# Patient Record
Sex: Male | Born: 1970 | Race: White | Hispanic: No | Marital: Single | Smoking: Never smoker
Health system: Southern US, Community
[De-identification: ages and names within clinical notes are randomized; demographics above are authoritative.]

## PROBLEM LIST (undated history)

## (undated) DIAGNOSIS — K5090 Crohn's disease, unspecified, without complications: Secondary | ICD-10-CM

## (undated) DIAGNOSIS — I82409 Acute embolism and thrombosis of unspecified deep veins of unspecified lower extremity: Secondary | ICD-10-CM

## (undated) HISTORY — PX: LEG SURGERY: SHX1003

## (undated) HISTORY — PX: HERNIA REPAIR: SHX51

---

## 2015-02-17 ENCOUNTER — Telehealth: Payer: Self-pay | Admitting: Internal Medicine

## 2015-02-17 NOTE — Telephone Encounter (Signed)
Received [facility] records and placed on Dr. Lamar Sprinkles desk for review. Patient is requesting a 2nd opinion of Crohn's colitis. Dr. Marina Goodell is Doc of the Day.

## 2015-02-19 NOTE — Telephone Encounter (Signed)
Dr. Russella Dar has declined to accept. Informed patient of this and he states that he would like his records shredded.

## 2015-02-19 NOTE — Telephone Encounter (Signed)
Dr. Marina Goodell has declined to accept patient. Records placed on Dr. Ardell Isaacs desk for review.

## 2016-09-28 ENCOUNTER — Emergency Department (HOSPITAL_BASED_OUTPATIENT_CLINIC_OR_DEPARTMENT_OTHER)
Admission: EM | Admit: 2016-09-28 | Discharge: 2016-09-28 | Disposition: A | Discharge status: Home / Self Care | Payer: 59 | Attending: Emergency Medicine | Admitting: Emergency Medicine

## 2016-09-28 ENCOUNTER — Encounter (HOSPITAL_BASED_OUTPATIENT_CLINIC_OR_DEPARTMENT_OTHER): Payer: Self-pay

## 2016-09-28 DIAGNOSIS — R600 Localized edema: Secondary | ICD-10-CM | POA: Insufficient documentation

## 2016-09-28 DIAGNOSIS — R2241 Localized swelling, mass and lump, right lower limb: Secondary | ICD-10-CM | POA: Diagnosis present

## 2016-09-28 HISTORY — DX: Crohn's disease, unspecified, without complications: K50.90

## 2016-09-28 MED ORDER — ENOXAPARIN SODIUM 80 MG/0.8ML ~~LOC~~ SOLN
1.0000 mg/kg | Freq: Once | SUBCUTANEOUS | Status: AC
  Administered 2016-09-28: 23:00:00 70 mg via SUBCUTANEOUS
  Filled 2016-09-28: qty 0.8

## 2016-09-28 NOTE — ED Triage Notes (Signed)
C/o chronic swelling to right LE-worse x 2 weeks-hx of surgery to leg 1999-denies new recent injury-NAD-steady gait

## 2016-09-28 NOTE — ED Provider Notes (Signed)
[facility]   CSN: [id number] Arrival date & time: 09/28/16  2110   By signing my name below, I, Freida Busman, attest that this documentation has been prepared under the direction and in the presence of Nyeshia Mysliwiec, Barbara Cower, MD . Electronically Signed: Freida Busman, Scribe. 09/28/2016. 10:13 PM.  History   Chief Complaint Chief Complaint  Patient presents with  . Leg Swelling   The history is provided by the patient. No language interpreter was used.    HPI Comments:  Victor Branch is a 46 y.o. male who presents to the Emergency Department complaining of gradually worsening, swelling to the right lower leg x a few weeks. He reports a h/o injury to the right ankle and surgery requiring hardware ~20 years ago. He denies pain to the RLE.  No recent trauma/injury, surgery, hospitalization or long periods of immobilization. No h/o PE/DVT. No fever, CP, or SOB. No alleviating factors noted.    Past Medical History:  Diagnosis Date  . Crohn disease (HCC)     There are no active problems to display for this patient.   Past Surgical History:  Procedure Laterality Date  . HERNIA REPAIR    . LEG SURGERY         Home Medications    Prior to Admission medications   Medication Sig Start Date End Date Taking? Authorizing Provider  PREDNISONE PO Take by mouth.   Yes [provider]    Family History No family history on file.  Social History Social History  Substance Use Topics  . Smoking status: Never Smoker  . Smokeless tobacco: Never Used  . Alcohol use No     Allergies   Patient has no known allergies.   Review of Systems Review of Systems  Constitutional: Negative for chills and fever.  Respiratory: Negative for shortness of breath.   Cardiovascular: Positive for leg swelling (ankle). Negative for chest pain.     Physical Exam Updated Vital Signs BP 125/88 (BP Location: Right Arm)   Pulse 81   Temp 98.2 F (36.8 C) (Oral)    Resp 16   Ht 6' (1.829 m)   Wt 70.8 kg (156 lb 1.6 oz)   SpO2 96%   BMI 21.17 kg/m   Physical Exam  Constitutional: He is oriented to person, place, and time. He appears well-developed and well-nourished. No distress.  HENT:  Head: Normocephalic and atraumatic.  Eyes: Conjunctivae are normal.  Cardiovascular: Normal rate.   Pulmonary/Chest: Effort normal.  Abdominal: He exhibits no distension.  Musculoskeletal: He exhibits edema.  Right leg with non pitting edema up to mid shin Equal DP pulses bilaterally No erythema, warmth or TTP in the RLE specifically in popliteal fossa or gastrocnemius No pain with dorsal or plantar flexion   Neurological: He is alert and oriented to person, place, and time.  Skin: Skin is warm and dry. No pallor.  Psychiatric: He has a normal mood and affect.  Nursing note and vitals reviewed.    ED Treatments / Results  DIAGNOSTIC STUDIES:  Oxygen Saturation is 99% on RA, normal by my interpretation.    COORDINATION OF CARE:  10:12 PM Discussed treatment plan with pt at bedside and pt agreed to plan.  Labs (all labs ordered are listed, but only abnormal results are displayed) Labs Reviewed - No data to display  EKG  EKG Interpretation None       Radiology No results found.  Procedures Procedures (including critical care time)  Medications Ordered  in ED Medications  enoxaparin (LOVENOX) injection 70 mg (70 mg Subcutaneous Given 09/28/16 2257)    Initial Impression / Assessment and Plan / ED Course  I have reviewed the triage vital signs and the nursing notes.  Pertinent labs & imaging results that were available during my care of the patient were reviewed by me and considered in my medical decision making (see chart for details).  Possible DVT. No e/o cellulitis. Can't get [place] at this time.  Counseled pt on Lovenox. Pt instructed to return tomorrow for [place] and to return sooner should he develop CP or SOB.   Final Clinical  Impressions(s) / ED Diagnoses   Final diagnoses:  Leg edema   I personally performed the services described in this documentation, which was scribed in my presence. The recorded information has been reviewed and is accurate.    Yina Riviere, Barbara Cower, MD 09/29/16 [phone]

## 2016-09-29 ENCOUNTER — Emergency Department (HOSPITAL_BASED_OUTPATIENT_CLINIC_OR_DEPARTMENT_OTHER)
Admission: EM | Admit: 2016-09-29 | Discharge: 2016-09-29 | Disposition: A | Discharge status: Home / Self Care | Payer: 59 | Attending: Physician Assistant | Admitting: Physician Assistant

## 2016-09-29 ENCOUNTER — Ambulatory Visit (HOSPITAL_BASED_OUTPATIENT_CLINIC_OR_DEPARTMENT_OTHER)
Admission: RE | Admit: 2016-09-29 | Discharge: 2016-09-29 | Disposition: A | Discharge status: Home / Self Care | Payer: 59 | Source: Ambulatory Visit | Attending: Emergency Medicine | Admitting: Emergency Medicine

## 2016-09-29 ENCOUNTER — Encounter (HOSPITAL_BASED_OUTPATIENT_CLINIC_OR_DEPARTMENT_OTHER): Payer: Self-pay

## 2016-09-29 DIAGNOSIS — Z79899 Other long term (current) drug therapy: Secondary | ICD-10-CM | POA: Insufficient documentation

## 2016-09-29 DIAGNOSIS — I82431 Acute embolism and thrombosis of right popliteal vein: Secondary | ICD-10-CM | POA: Insufficient documentation

## 2016-09-29 DIAGNOSIS — K5090 Crohn's disease, unspecified, without complications: Secondary | ICD-10-CM | POA: Diagnosis not present

## 2016-09-29 DIAGNOSIS — I824Y1 Acute embolism and thrombosis of unspecified deep veins of right proximal lower extremity: Secondary | ICD-10-CM | POA: Diagnosis not present

## 2016-09-29 DIAGNOSIS — I82519 Chronic embolism and thrombosis of unspecified femoral vein: Secondary | ICD-10-CM | POA: Diagnosis present

## 2016-09-29 DIAGNOSIS — I82411 Acute embolism and thrombosis of right femoral vein: Secondary | ICD-10-CM | POA: Insufficient documentation

## 2016-09-29 DIAGNOSIS — I82539 Chronic embolism and thrombosis of unspecified popliteal vein: Secondary | ICD-10-CM | POA: Diagnosis not present

## 2016-09-29 DIAGNOSIS — M7989 Other specified soft tissue disorders: Secondary | ICD-10-CM | POA: Insufficient documentation

## 2016-09-29 LAB — CBC WITH DIFFERENTIAL/PLATELET
Basophils Absolute: 0 10*3/uL (ref 0.0–0.1)
Basophils Relative: 0 %
EOS ABS: 0 10*3/uL (ref 0.0–0.7)
EOS PCT: 1 %
HCT: 37.7 % — ABNORMAL LOW (ref 39.0–52.0)
Hemoglobin: 12.3 g/dL — ABNORMAL LOW (ref 13.0–17.0)
LYMPHS ABS: 1.9 10*3/uL (ref 0.7–4.0)
Lymphocytes Relative: 29 %
MCH: 24.6 pg — ABNORMAL LOW (ref 26.0–34.0)
MCHC: 32.6 g/dL (ref 30.0–36.0)
MCV: 75.6 fL — ABNORMAL LOW (ref 78.0–100.0)
MONOS PCT: 12 %
Monocytes Absolute: 0.8 10*3/uL (ref 0.1–1.0)
Neutro Abs: 3.7 10*3/uL (ref 1.7–7.7)
Neutrophils Relative %: 58 %
PLATELETS: 254 10*3/uL (ref 150–400)
RBC: 4.99 MIL/uL (ref 4.22–5.81)
RDW: 15.4 % (ref 11.5–15.5)
WBC: 6.3 10*3/uL (ref 4.0–10.5)

## 2016-09-29 LAB — COMPREHENSIVE METABOLIC PANEL
ALT: 31 U/L (ref 17–63)
ANION GAP: 7 (ref 5–15)
AST: 27 U/L (ref 15–41)
Albumin: 3.5 g/dL (ref 3.5–5.0)
Alkaline Phosphatase: 99 U/L (ref 38–126)
BUN: 16 mg/dL (ref 6–20)
CALCIUM: 9.1 mg/dL (ref 8.9–10.3)
CHLORIDE: 105 mmol/L (ref 101–111)
CO2: 25 mmol/L (ref 22–32)
Creatinine, Ser: 1.14 mg/dL (ref 0.61–1.24)
GFR calc non Af Amer: >60 mL/min (ref >60)
Glucose, Bld: 98 mg/dL (ref 65–99)
Potassium: 4.3 mmol/L (ref 3.5–5.1)
SODIUM: 137 mmol/L (ref 135–145)
Total Bilirubin: 0.5 mg/dL (ref 0.3–1.2)
Total Protein: 6.6 g/dL (ref 6.5–8.1)

## 2016-09-29 LAB — PROTIME-INR
INR: 1
Prothrombin Time: 13.2 seconds (ref 11.4–15.2)

## 2016-09-29 MED ORDER — RIVAROXABAN (XARELTO) EDUCATION KIT FOR DVT/PE PATIENTS
PACK | Freq: Once | Status: AC
  Administered 2016-09-29: 17:00:00

## 2016-09-29 MED ORDER — RIVAROXABAN 15 MG PO TABS
15.0000 mg | ORAL_TABLET | Freq: Two times a day (BID) | ORAL | Status: DC
  Administered 2016-09-29: 15 mg via ORAL
  Filled 2016-09-29: qty 1

## 2016-09-29 MED ORDER — RIVAROXABAN (XARELTO) VTE STARTER PACK (15 & 20 MG): ORAL_TABLET | ORAL | 0 refills | Status: DC

## 2016-09-29 MED FILL — XARELTO STARTER PACK: 15 & 20 | 30 days supply | Qty: 51 | Fill #0

## 2016-09-29 NOTE — Discharge Instructions (Signed)
You were seen today for swelling in your leg and found to have a blood clot. We are treating Victor Branch with Xarelto, this medication will prevent second clot, we anticipate the clot to be able to dissolve over time. However we will need you to follow up with interventional radiology to see if they have any advice to help decrease down stream sequela of this clot. Please call the office on Monday and ask for follow-up appointment for DVT.  In addition we'll be taking this new medication called Xarelto. Cannot take other medications such as ibuprofen with it. Please follow-up with your primary care physician. We talked to the coverage of Dr. Luiz Iron who will need to see you for follow-up in the next couple weeks.

## 2016-09-29 NOTE — ED Provider Notes (Signed)
[facility] Provider Note   CSN: [id number] Arrival date & time: 09/29/16  1501     History   Chief Complaint Chief Complaint  Patient presents with  . DVT    HPI Jaylin Benzel is a 46 y.o. male.  HPI  Pt is a 46 year old seen yesterdya for leg swelling set up for DVT US today. DVT US shows + DVT in femoral and popilteal. This has been going on for 2 weeks. No recent travel. Pt has history of rod in the RLE 20 years ago.  Very active patient. Ho chron's on prednisone.   Past Medical History:  Diagnosis Date  . Crohn disease ([place])     There are no active problems to display for this patient.   Past Surgical History:  Procedure Laterality Date  . HERNIA REPAIR    . LEG SURGERY         Home Medications    Prior to Admission medications   Medication Sig Start Date End Date Taking? Authorizing Provider  PREDNISONE PO Take by mouth.    [provider]    Family History No family history on file.  Social History Social History  Substance Use Topics  . Smoking status: Never Smoker  . Smokeless tobacco: Never Used  . Alcohol use No     Allergies   Patient has no known allergies.   Review of Systems Review of Systems  Constitutional: Negative for activity change, fatigue and fever.  Respiratory: Negative for cough, chest tightness and shortness of breath.   Cardiovascular: Positive for leg swelling. Negative for chest pain.  Gastrointestinal: Negative for abdominal pain.     Physical Exam Updated Vital Signs BP 129/71 (BP Location: Right Arm)   Pulse 76   Temp 98.9 F (37.2 C) (Oral)   Resp 18   Ht 6' (1.829 m)   Wt 70.8 kg (156 lb)   SpO2 100%   BMI 21.16 kg/m   Physical Exam  Constitutional: He is oriented to person, place, and time. He appears well-nourished.  HENT:  Head: Normocephalic.  Eyes: Conjunctivae are normal.  Cardiovascular: Normal rate and regular rhythm.   Pulmonary/Chest: Effort normal and breath  sounds normal. No respiratory distress. He has no wheezes. He exhibits no tenderness.  Abdominal: Soft. There is no tenderness.  Musculoskeletal: He exhibits edema.  RLE with edema. + pulses. Sensation intact.   Neurological: He is oriented to person, place, and time.  Skin: Skin is warm and dry. He is not diaphoretic.  Psychiatric: He has a normal mood and affect. His behavior is normal.     ED Treatments / Results  Labs (all labs ordered are listed, but only abnormal results are displayed) Labs Reviewed  CBC WITH DIFFERENTIAL/PLATELET - Abnormal; Notable for the following:       Result Value   Hemoglobin 12.3 (*)    HCT 37.7 (*)    MCV 75.6 (*)    MCH 24.6 (*)    All other components within normal limits  COMPREHENSIVE METABOLIC PANEL  PROTIME-INR    EKG  EKG Interpretation None       Radiology US Venous Img Lower Unilateral Right  Result Date: 09/29/2016 CLINICAL DATA:  46 y/o M; right lower leg swelling and redness for 2 weeks. EXAM: RIGHT LOWER EXTREMITY VENOUS DOPPLER ULTRASOUND TECHNIQUE: Gray-scale sonography with graded compression, as well as color Doppler and duplex ultrasound were performed to evaluate the lower extremity deep venous systems from the level of  the common femoral vein and including the common femoral, femoral, profunda femoral, popliteal and calf veins including the posterior tibial, peroneal and gastrocnemius veins when visible. The superficial great saphenous vein was also interrogated. Spectral Doppler was utilized to evaluate flow at rest and with distal augmentation maneuvers in the common femoral, femoral and popliteal veins. COMPARISON:  None. FINDINGS: Contralateral Common Femoral Vein: Respiratory phasicity is normal and symmetric with the symptomatic side. No evidence of thrombus. Normal compressibility. Common Femoral Vein: No evidence of thrombus. Normal compressibility, respiratory phasicity and response to augmentation. Saphenofemoral  Junction: No evidence of thrombus. Normal compressibility and flow on color Doppler imaging. Profunda Femoral Vein: No evidence of thrombus. Normal compressibility and flow on color Doppler imaging. Femoral Vein: Nonocclusive noncompressible filling defect distally. Popliteal Vein: Not nearly occlusive noncompressible filling defect. Calf Veins: Suboptimal evaluation due to extensive edema. No definite evidence for thrombus. Superficial Great Saphenous Vein: No evidence of thrombus. Normal compressibility and flow on color Doppler imaging. Venous Reflux:  None. Other Findings:  None. IMPRESSION: Positive for deep venous thrombosis of the distal right femoral and popliteal veins, nearly occlusive in the popliteal vein. These results will be called to the ordering clinician or representative by the Radiologist Assistant, and communication documented in the PACS or zVision Dashboard. Electronically Signed   By: Kristine Garbe M.D.   On: 09/29/2016 14:50    Procedures Procedures (including critical care time)  Medications Ordered in ED Medications  rivaroxaban (XARELTO) Education Kit for DVT/PE patients (not administered)  Rivaroxaban (XARELTO) tablet 15 mg (not administered)     Initial Impression / Assessment and Plan / ED Course  I have reviewed the triage vital signs and the nursing notes.  Pertinent labs & imaging results that were available during my care of the patient were reviewed by me and considered in my medical decision making (see chart for details).    Patient is a 46 year old male with history of Crohn's, on prednisone presenting with right lower extremity edema. This going on for over 2 weeks. Patient has no risk factors.  Discussed with interventional radiology on call. 2 weeks is on the outside windo that would help with targeted, lysis. They recommend follow-up in clinic.  Patient has no shortness breath do not suspect PE.  Discussed with the on-call provider for  patient's primary care physician. Will treat with Julious Oka and have patient follow up in clinic.   Final Clinical Impressions(s) / ED Diagnoses   Final diagnoses:  None    New Prescriptions New Prescriptions   No medications on file     Macarthur Critchley, MD 09/29/16 1622

## 2016-09-29 NOTE — ED Notes (Signed)
ED Provider at bedside. 

## 2016-09-29 NOTE — ED Notes (Signed)
Pt understands he is to make f/u visits

## 2016-09-29 NOTE — ED Notes (Signed)
Kit given with instructions, verbal understanding

## 2016-09-29 NOTE — ED Triage Notes (Signed)
Pt was seen yesterday-dx with DVT right leg by out pt [place] today-swelling to right leg x 2 weeks-NAD-steady gait

## 2019-04-26 IMAGING — US US EXTREM LOW VENOUS*R*
1 series · 13 of 24 positions shown · non-contrast
Comparison: None.

CLINICAL DATA: 46 y/o M; right lower leg swelling and redness for 2
weeks.



[Series 1: us extrem low venous*right* · 0.07mm/px · 13 of 37 slices shown]
[im 1/37]
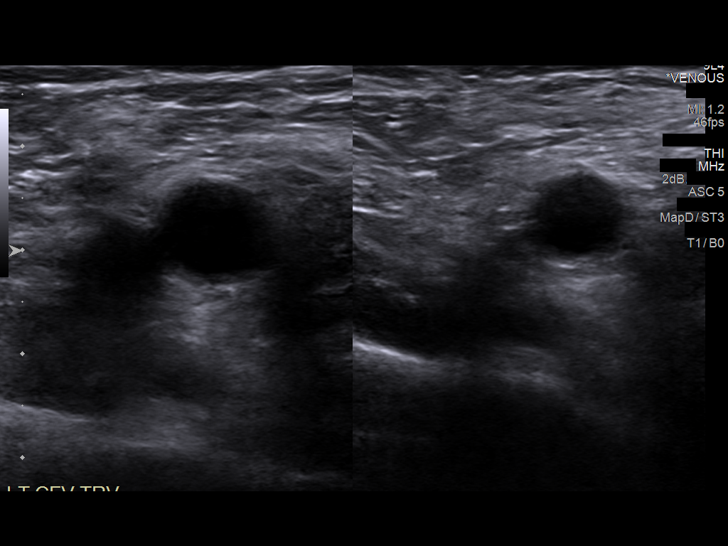
[im 4/37]
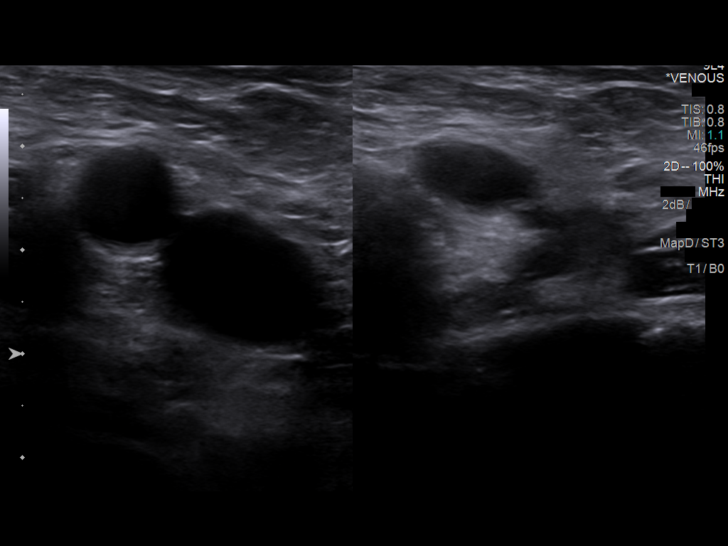
[im 7/37]
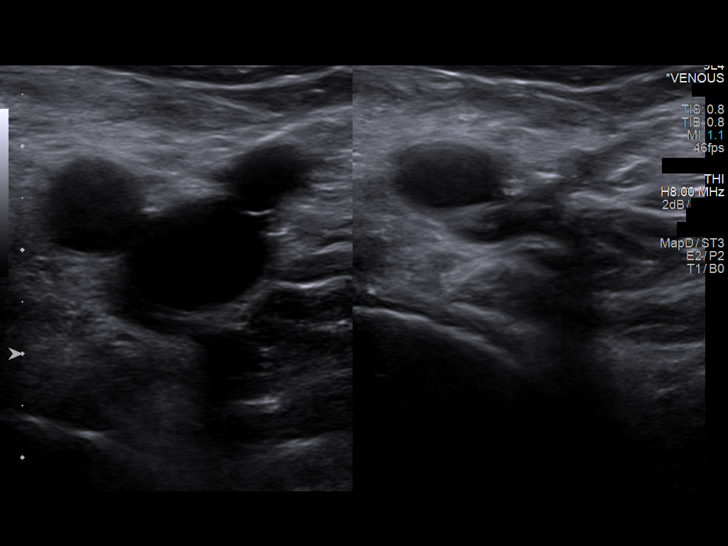
[im 10/37]
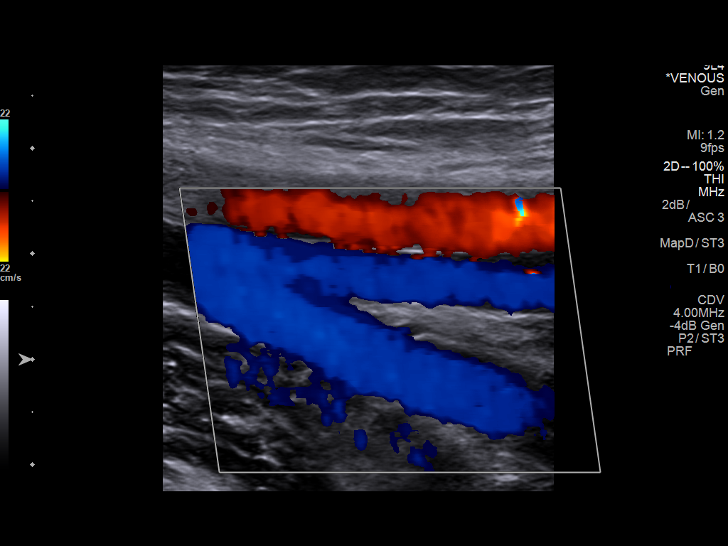
[im 13/37]
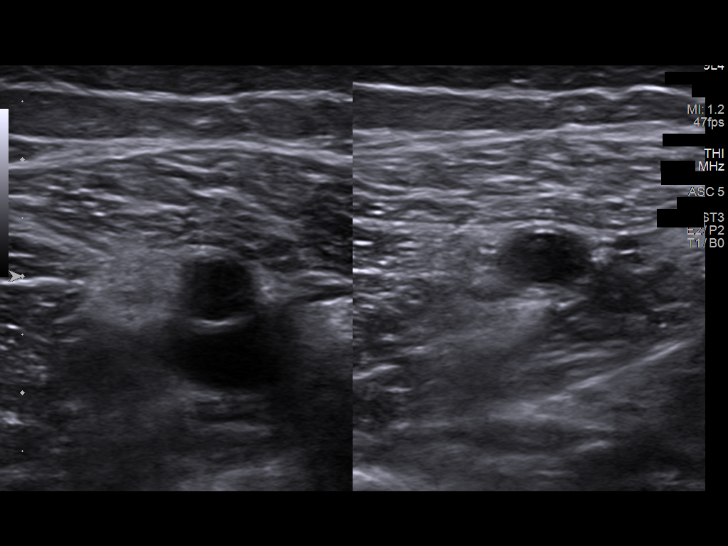
[im 16/37]
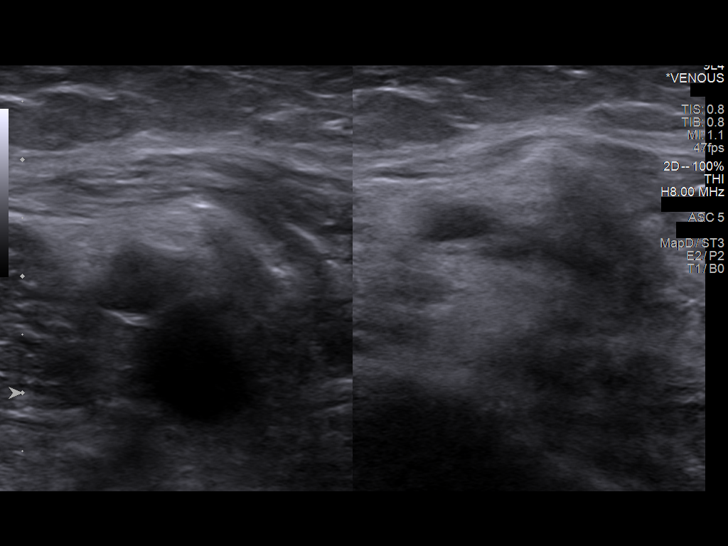
[im 19/37]
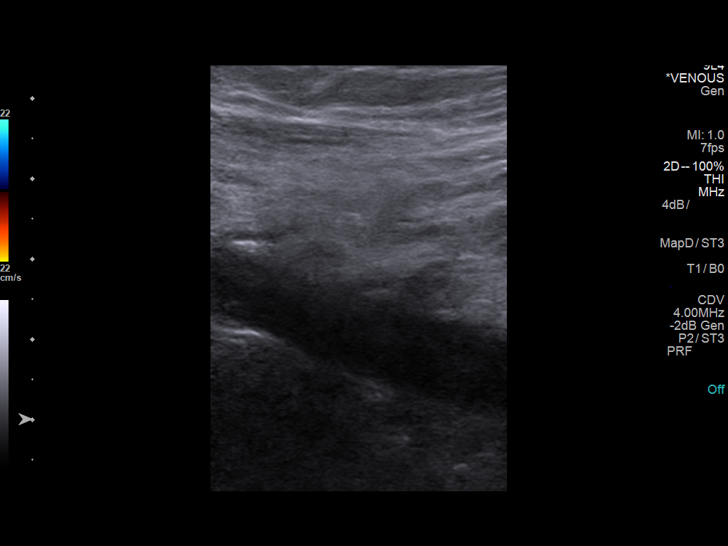
[im 21/37]
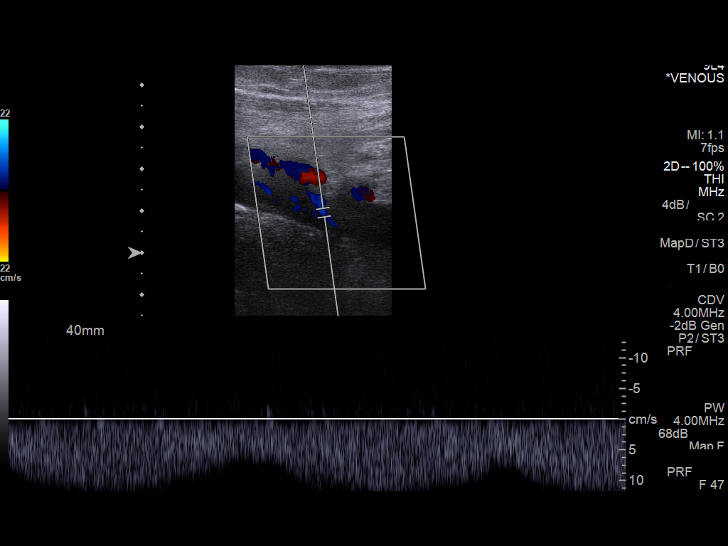
[im 24/37]
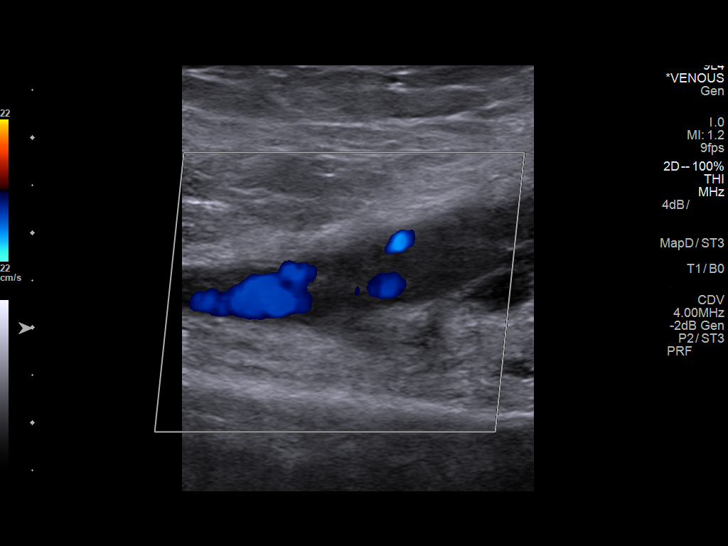
[im 27/37]
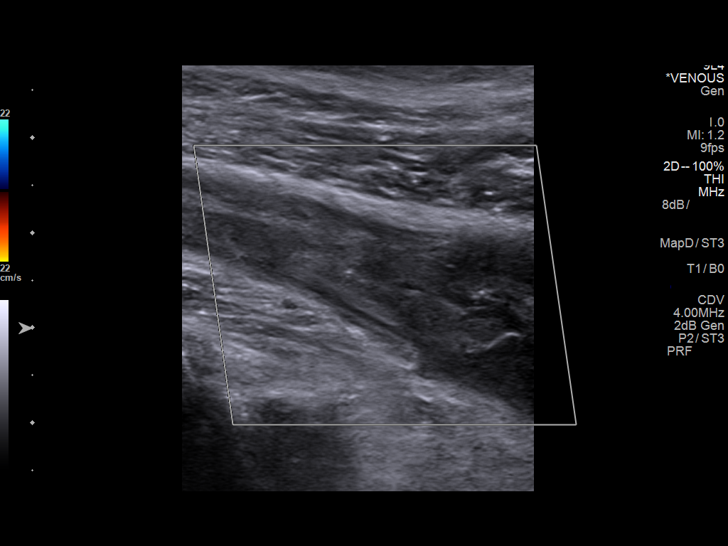
[im 30/37]
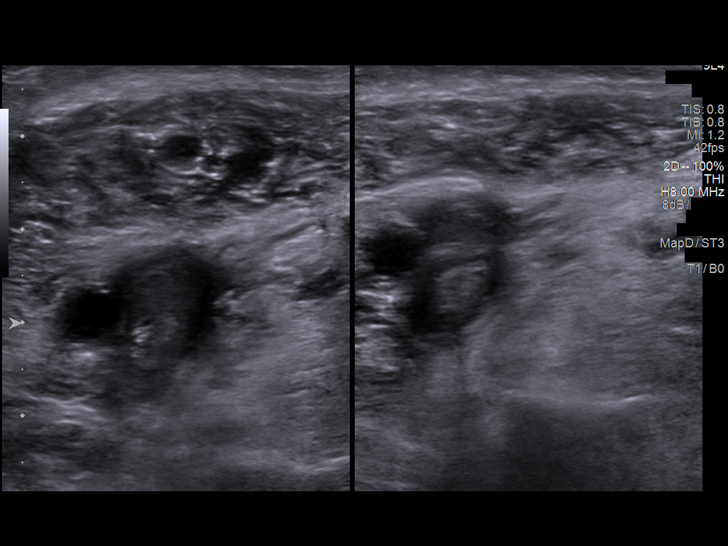
[im 33/37]
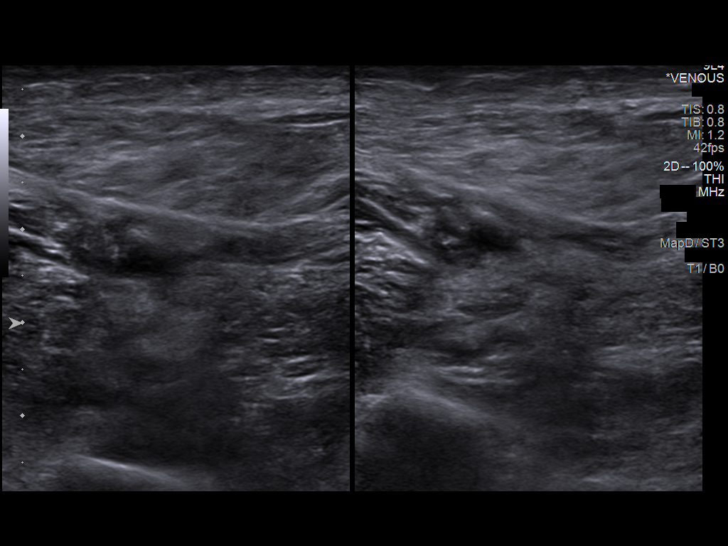
[im 37/37]
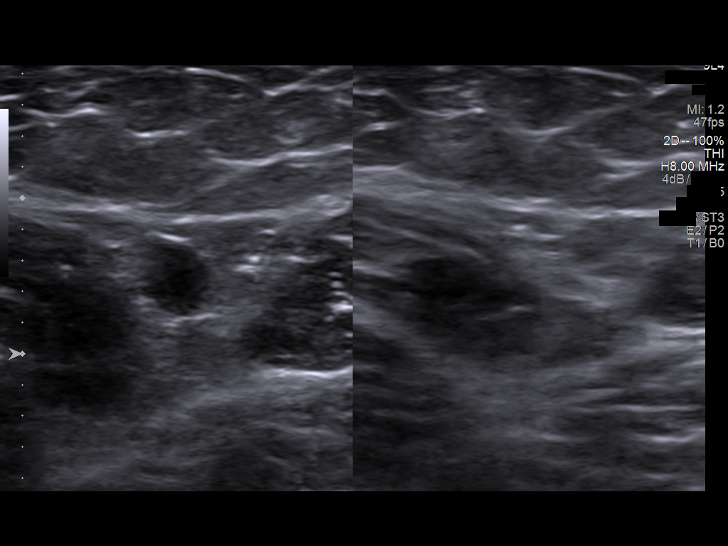

[13 of 24 positions shown; findings below may reference images not displayed]

FINDINGS: Contralateral Common Femoral Vein: Respiratory phasicity is normal
and symmetric with the symptomatic side. No evidence of thrombus.
Normal compressibility.

Common Femoral Vein: No evidence of thrombus. Normal
compressibility, respiratory phasicity and response to augmentation.

Saphenofemoral Junction: No evidence of thrombus. Normal
compressibility and flow on color Doppler imaging.

Profunda Femoral Vein: No evidence of thrombus. Normal
compressibility and flow on color Doppler imaging.

Femoral Vein: Nonocclusive noncompressible filling defect distally.

Popliteal Vein: Not nearly occlusive noncompressible filling defect.

Calf Veins: Suboptimal evaluation due to extensive edema. No
definite evidence for thrombus.

Superficial Great Saphenous Vein: No evidence of thrombus. Normal
compressibility and flow on color Doppler imaging.

Venous Reflux:  None.

Other Findings:  None.
IMPRESSION: Positive for deep venous thrombosis of the distal right femoral and
popliteal veins, nearly occlusive in the popliteal vein.

These results will be called to the ordering clinician or
representative by the Radiologist Assistant, and communication
documented in the PACS or zVision Dashboard.

By: Lucy Elena Bravo M.D.

## 2023-04-23 ENCOUNTER — Encounter (HOSPITAL_BASED_OUTPATIENT_CLINIC_OR_DEPARTMENT_OTHER): Payer: Self-pay | Admitting: Emergency Medicine

## 2023-04-23 ENCOUNTER — Observation Stay (HOSPITAL_BASED_OUTPATIENT_CLINIC_OR_DEPARTMENT_OTHER)
Admission: EM | Admit: 2023-04-23 | Discharge: 2023-04-26 | Disposition: A | Discharge status: Home / Self Care | Payer: BC Managed Care – PPO | Attending: Internal Medicine | Admitting: Internal Medicine

## 2023-04-23 ENCOUNTER — Emergency Department (HOSPITAL_BASED_OUTPATIENT_CLINIC_OR_DEPARTMENT_OTHER): Payer: Self-pay

## 2023-04-23 ENCOUNTER — Other Ambulatory Visit: Payer: Self-pay

## 2023-04-23 DIAGNOSIS — E0590 Thyrotoxicosis, unspecified without thyrotoxic crisis or storm: Secondary | ICD-10-CM | POA: Diagnosis present

## 2023-04-23 DIAGNOSIS — E039 Hypothyroidism, unspecified: Secondary | ICD-10-CM | POA: Insufficient documentation

## 2023-04-23 DIAGNOSIS — Z87891 Personal history of nicotine dependence: Secondary | ICD-10-CM | POA: Insufficient documentation

## 2023-04-23 DIAGNOSIS — J121 Respiratory syncytial virus pneumonia: Secondary | ICD-10-CM | POA: Diagnosis not present

## 2023-04-23 DIAGNOSIS — R079 Chest pain, unspecified: Secondary | ICD-10-CM | POA: Diagnosis present

## 2023-04-23 DIAGNOSIS — Z79899 Other long term (current) drug therapy: Secondary | ICD-10-CM | POA: Insufficient documentation

## 2023-04-23 DIAGNOSIS — Z1152 Encounter for screening for COVID-19: Secondary | ICD-10-CM | POA: Diagnosis not present

## 2023-04-23 DIAGNOSIS — I2694 Multiple subsegmental pulmonary emboli without acute cor pulmonale: Principal | ICD-10-CM | POA: Insufficient documentation

## 2023-04-23 DIAGNOSIS — J9601 Acute respiratory failure with hypoxia: Secondary | ICD-10-CM | POA: Diagnosis not present

## 2023-04-23 DIAGNOSIS — K5090 Crohn's disease, unspecified, without complications: Secondary | ICD-10-CM | POA: Diagnosis not present

## 2023-04-23 DIAGNOSIS — I2699 Other pulmonary embolism without acute cor pulmonale: Secondary | ICD-10-CM | POA: Diagnosis present

## 2023-04-23 HISTORY — DX: Acute embolism and thrombosis of unspecified deep veins of unspecified lower extremity: I82.409

## 2023-04-23 LAB — BASIC METABOLIC PANEL
Anion gap: 8 (ref 5–15)
BUN: 15 mg/dL (ref 6–20)
CO2: 24 mmol/L (ref 22–32)
Calcium: 8.5 mg/dL — ABNORMAL LOW (ref 8.9–10.3)
Chloride: 100 mmol/L (ref 98–111)
Creatinine, Ser: 1.07 mg/dL (ref 0.61–1.24)
GFR, Estimated: >60 mL/min (ref >60)
Glucose, Bld: 109 mg/dL — ABNORMAL HIGH (ref 70–99)
Potassium: 3.6 mmol/L (ref 3.5–5.1)
Sodium: 132 mmol/L — ABNORMAL LOW (ref 135–145)

## 2023-04-23 LAB — CBC
HCT: 33.7 % — ABNORMAL LOW (ref 39.0–52.0)
Hemoglobin: 10.9 g/dL — ABNORMAL LOW (ref 13.0–17.0)
MCH: 27.5 pg (ref 26.0–34.0)
MCHC: 32.3 g/dL (ref 30.0–36.0)
MCV: 84.9 fL (ref 80.0–100.0)
Platelets: 280 10*3/uL (ref 150–400)
RBC: 3.97 MIL/uL — ABNORMAL LOW (ref 4.22–5.81)
RDW: 14.4 % (ref 11.5–15.5)
WBC: 9.8 10*3/uL (ref 4.0–10.5)
nRBC: 0 % (ref 0.0–0.2)

## 2023-04-23 LAB — TROPONIN I (HIGH SENSITIVITY): Troponin I (High Sensitivity): 3 ng/L (ref <18)

## 2023-04-23 LAB — D-DIMER, QUANTITATIVE: D-Dimer, Quant: 2.58 ug{FEU}/mL — ABNORMAL HIGH (ref 0.00–0.50)

## 2023-04-23 MED ORDER — HEPARIN (PORCINE) 25000 UT/250ML-% IV SOLN
1300.0000 [IU]/h | INTRAVENOUS | Status: DC
  Administered 2023-04-23: 1300 [IU]/h via INTRAVENOUS
  Filled 2023-04-23: qty 250

## 2023-04-23 MED ORDER — IOHEXOL 350 MG/ML SOLN
75.0000 mL | Freq: Once | INTRAVENOUS | Status: AC | PRN
  Administered 2023-04-23: 75 mL via INTRAVENOUS

## 2023-04-23 NOTE — ED Notes (Signed)
 Patient transported to CT

## 2023-04-23 NOTE — Progress Notes (Addendum)
 PHARMACY - ANTICOAGULATION CONSULT NOTE  Pharmacy Consult for IV heparin   Indication: pulmonary embolus  No Known Allergies  Patient Measurements: Height: 6' (182.9 cm) Weight: 77.3 kg (170 lb 8 oz) IBW/kg (Calculated) : 77.6 Heparin  Dosing Weight: 77.3 kg  Vital Signs: Temp: 98.2 F (36.8 C) (01/13 2038) Temp Source: Oral (01/13 2038) BP: 140/86 (01/13 2038) Pulse Rate: 86 (01/13 2038)  Labs: Recent Labs    04/23/23 2043  HGB 10.9*  HCT 33.7*  PLT 280  CREATININE 1.07    Estimated Creatinine Clearance: 87.3 mL/min (by C-G formula based on SCr of 1.07 mg/dL).   Medical History: Past Medical History:  Diagnosis Date   Crohn disease (HCC)    DVT (deep venous thrombosis) (HCC)    right leg    Assessment: Victor Branch is a 53 y.o. year old male admitted on 04/23/2023 with concern for PE. CTA ab/pelv with  moderate to marked severity bilateral lower lobe PE right greater than left (no R heart strain noted). Xarelto  prior to admission (last dose 1/13 1730). Pt has missed several doses recently. Pharmacy consulted to dose heparin .  Goal of Therapy:  aPTT 66-102 seconds Monitor platelets by anticoagulation protocol: Yes   Plan:  Heparin  infusion at 1300 units/hr (no bolus) 6 aPTT  Daily aPTT, heparin  level, CBC, and monitoring for bleeding F/u plans for anticoagulation   Thank you for allowing pharmacy to participate in this patient's care.  Leonor GORMAN Bash, PharmD Emergency Medicine Clinical Pharmacist 04/23/2023,11:30 PM

## 2023-04-23 NOTE — ED Triage Notes (Signed)
 Patient presents with right sided back pain x 3 days. Reports he was evaluated by PCP earlier today and advised to come to ED to r/o PE. Denies chest pain or sob Hx DVT

## 2023-04-23 NOTE — ED Provider Notes (Addendum)
 [facility] Provider Note   CSN: [id number] Arrival date & time: 04/23/23  2028     History  Chief Complaint  Patient presents with   Back Pain    Victor Branch is a 53 y.o. male.  With a history of Crohn's disease, recurrent DVT on Xarelto  presenting to the ED for evaluation of right-sided back pain.  Symptoms began 3 days ago.  Localized deep to the scapula on the right side.  He reports noncompliance with his Xarelto  stating he frequently misses doses.  He reports an episode of blood-tinged sputum earlier today.  He reports he had an upper respiratory infection 1 week ago and has still dealing with minimal symptoms of this.  He denies any fevers or chills.  No chest pain.  He reports minimal shortness of breath.  Pain is worse with deep inspiration.   Back Pain      Home Medications Prior to Admission medications   Medication Sig Start Date End Date Taking? Authorizing Provider  folic acid  (FOLVITE ) 1 MG tablet Take 1 mg by mouth daily.   Yes [provider]  methimazole  (TAPAZOLE ) 5 MG tablet Take 5 mg by mouth daily.   Yes [provider]  rivaroxaban  (XARELTO ) 20 MG TABS tablet Take 20 mg by mouth daily with supper.   Yes [provider]  sulfaSALAzine  (AZULFIDINE ) 500 MG tablet Take 1,000 mg by mouth 3 (three) times daily.   Yes [provider]  PREDNISONE PO Take by mouth. Patient not taking: Reported on 04/24/2023    [provider]  Rivaroxaban  15 & 20 MG TBPK Take as directed on package: Start with one 15mg  tablet by mouth twice a day with food. On Day 22, switch to one 20mg  tablet once a day with food. Patient not taking: Reported on 04/24/2023 09/29/16   Nelta Jackye Saha, MD      Allergies    Patient has no known allergies.    Review of Systems   Review of Systems  Musculoskeletal:  Positive for back pain.  All other systems reviewed and are negative.   Physical  Exam Updated Vital Signs BP 125/83 (BP Location: Left Arm)   Pulse (!) 108   Temp 99.6 F (37.6 C) (Oral)   Resp 20   Ht 6' (1.829 m)   Wt 73.6 kg   SpO2 94%   BMI 22.00 kg/m  Physical Exam Vitals and nursing note reviewed.  Constitutional:      General: He is not in acute distress.    Appearance: He is well-developed.     Comments: Resting comfortably in bed  HENT:     Head: Normocephalic and atraumatic.  Eyes:     Conjunctiva/sclera: Conjunctivae normal.  Cardiovascular:     Rate and Rhythm: Normal rate and regular rhythm.     Heart sounds: No murmur heard. Pulmonary:     Effort: Pulmonary effort is normal. No respiratory distress.     Breath sounds: Normal breath sounds. No wheezing, rhonchi or rales.  Abdominal:     Palpations: Abdomen is soft.     Tenderness: There is no abdominal tenderness. There is no guarding.  Musculoskeletal:        General: No swelling.     Cervical back: Neck supple.  Skin:    General: Skin is warm and dry.     Capillary Refill: Capillary refill takes less than 2 seconds.  Neurological:     Mental Status: He  is alert.  Psychiatric:        Mood and Affect: Mood normal.     ED Results / Procedures / Treatments   Labs (all labs ordered are listed, but only abnormal results are displayed) Labs Reviewed  BASIC METABOLIC PANEL - Abnormal; Notable for the following components:      Result Value   Sodium 132 (*)    Glucose, Bld 109 (*)    Calcium 8.5 (*)    All other components within normal limits  CBC - Abnormal; Notable for the following components:   RBC 3.97 (*)    Hemoglobin 10.9 (*)    HCT 33.7 (*)    All other components within normal limits  D-DIMER, QUANTITATIVE - Abnormal; Notable for the following components:   D-Dimer, Quant 2.58 (*)    All other components within normal limits  APTT - Abnormal; Notable for the following components:   aPTT 83 (*)    All other components within normal limits  APTT - Abnormal; Notable  for the following components:   aPTT 44 (*)    All other components within normal limits  HEPARIN  LEVEL (UNFRACTIONATED) - Abnormal; Notable for the following components:   Heparin  Unfractionated >1.10 (*)    All other components within normal limits  APTT - Abnormal; Notable for the following components:   aPTT 42 (*)    All other components within normal limits  SARS CORONAVIRUS 2 BY RT PCR  RESPIRATORY PANEL BY PCR  BRAIN NATRIURETIC PEPTIDE  COMPREHENSIVE METABOLIC PANEL  CBC  PROTIME-INR  TROPONIN I (HIGH SENSITIVITY)  TROPONIN I (HIGH SENSITIVITY)    EKG EKG Interpretation Date/Time:  Monday April 23 2023 23:44:10 EST Ventricular Rate:  75 PR Interval:  199 QRS Duration:  87 QT Interval:  359 QTC Calculation: 401 R Axis:   60  Text Interpretation: Sinus rhythm RSR' in V1 or V2, probably normal variant No acute changes No old tracing to compare Confirmed by Charlyn Sora [phone]) on 04/24/2023 10:22:19 AM  Radiology CT Angio Chest PE W/Cm &/Or Wo Cm Result Date: 04/23/2023 CLINICAL DATA:  Right subscapular back pain. EXAM: CT ANGIOGRAPHY CHEST WITH CONTRAST TECHNIQUE: Multidetector CT imaging of the chest was performed using the standard protocol during bolus administration of intravenous contrast. Multiplanar CT image reconstructions and MIPs were obtained to evaluate the vascular anatomy. RADIATION DOSE REDUCTION: This exam was performed according to the departmental dose-optimization program which includes automated exposure control, adjustment of the mA and/or kV according to patient size and/or use of iterative reconstruction technique. CONTRAST:  75mL OMNIPAQUE  IOHEXOL  350 MG/ML SOLN COMPARISON:  July 22, 2019 FINDINGS: Cardiovascular: The thoracic aorta is normal in appearance. Satisfactory opacification of the pulmonary arteries to the segmental level. Moderate to marked severity areas of intraluminal low attenuation are seen involving multiple lower lobe branches of  the right pulmonary artery. Mild involvement of lower lobe branches of the left pulmonary artery is also seen. Normal heart size without evidence of right heart strain (RV/LV ratio of 0.84). No pericardial effusion. Mediastinum/Nodes: No enlarged mediastinal, hilar, or axillary lymph nodes. Thyroid gland, trachea, and esophagus demonstrate no significant findings. Lungs/Pleura: Mild scarring and/or atelectasis is seen within the right apex. Moderate severity areas of scarring, atelectasis and/or infiltrate are seen within the bilateral lung bases. There is a very small right pleural effusion. No pneumothorax is identified. Upper Abdomen: No acute abnormality. Musculoskeletal: No chest wall abnormality. No acute or significant osseous findings. Review of the MIP images confirms the  above findings. IMPRESSION: 1. Moderate to marked severity bilateral lower lobe pulmonary emboli, right greater than left. 2. Moderate severity bibasilar scarring, atelectasis and/or infiltrate. 3. Very small right pleural effusion. Electronically Signed   By: Suzen Dials M.D.   On: 04/23/2023 23:10    Procedures .Critical Care  Performed by: Edwardo Marsa HERO, PA-C Authorized by: Edwardo Marsa HERO, PA-C   Critical care provider statement:    Critical care time (minutes):  48   Critical care was necessary to treat or prevent imminent or life-threatening deterioration of the following conditions:  Circulatory failure   Critical care was time spent personally by me on the following activities:  Development of treatment plan with patient or surrogate, discussions with consultants, evaluation of patient's response to treatment, examination of patient, ordering and review of laboratory studies, ordering and review of radiographic studies, ordering and performing treatments and interventions, pulse oximetry, re-evaluation of patient's condition and review of old charts   Care discussed with: admitting provider   Comments:      Multiple bilateral PE requiring heparin  gtt     Medications Ordered in ED Medications  folic acid  (FOLVITE ) tablet 1 mg (1 mg Oral Given 04/24/23 1712)  sulfaSALAzine  (AZULFIDINE ) tablet 1,000 mg (1,000 mg Oral Given 04/24/23 2014)  methimazole  (TAPAZOLE ) tablet 5 mg (5 mg Oral Given 04/24/23 1712)  oxyCODONE -acetaminophen  (PERCOCET/ROXICET) 5-325 MG per tablet 1 tablet (has no administration in time range)  enoxaparin  (LOVENOX ) injection 80 mg (80 mg Subcutaneous Given 04/24/23 1808)  guaiFENesin -dextromethorphan  (ROBITUSSIN DM) 100-10 MG/5ML syrup 5 mL (5 mLs Oral Given 04/24/23 2014)  acetaminophen  (TYLENOL ) tablet 650 mg (650 mg Oral Given 04/24/23 2310)  iohexol  (OMNIPAQUE ) 350 MG/ML injection 75 mL (75 mLs Intravenous Contrast Given 04/23/23 2250)  sodium chloride  0.9 % bolus 250 mL (250 mLs Intravenous New Bag/Given 04/24/23 2056)    ED Course/ Medical Decision Making/ A&P Clinical Course as of 04/24/23 2344  Mon Apr 23, 2023  2347 Spoke with Dr. Shona who will admit [AS]    Clinical Course User Index [AS] Edwardo Marsa HERO, PA-C                                 Medical Decision Making Amount and/or Complexity of Data Reviewed Labs: ordered. Radiology: ordered.  Risk Prescription drug management. Decision regarding hospitalization.  This patient presents to the ED for concern of cough, right-sided upper back pain, this involves an extensive number of treatment options, and is a complaint that carries with it a high risk of complications and morbidity.  Differential diagnosis for emergent cause of cough includes but is not limited to upper respiratory infection, lower respiratory infection, allergies, asthma, irritants, foreign body, medications such as ACE inhibitors, reflux, asthma, CHF, lung cancer, interstitial lung disease, psychiatric causes, postnasal drip and postinfectious bronchospasm.   My initial workup includes labs, CT PE study  Additional history obtained  from: Nursing notes from this visit. Wife at bedside provides portion of the history Chart review shows outpatient PCP visit earlier today concerned for PE due to blood-tinged sputum and history of recurrent DVT  I ordered, reviewed and interpreted labs which include: CBC, BMP, D-dimer.  No leukocytosis.  Mild anemia with hemoglobin of 10.9.  Mild hyponatremia of 132.  D-dimer elevated to 2.58  I ordered imaging studies including CT PE study I independently visualized and interpreted imaging which showed bilateral lower lobe pulmonary emboli, moderate to marked severity,  right worse than left, no right heart strain I agree with the radiologist interpretation  Discussed care with hospitalist Dr. Shona  Afebrile, hemodynamically stable.  53 year old male presenting to the ED for evaluation of right upper back pain.  Worse with deep inspiration.  Symptoms began 3 days ago.  Has a history of recurrent DVT on Xarelto .  Reports poor compliance with this medication.  Reports some blood-tinged sputum today as well.  Primary care provider concern for PE.  He has no chest pain.  He is not tachycardic, tachypneic or hypoxic.  Lab workup was reassuring.  Mild anemia.  CT PE study concerning for bilateral lower lobe pulmonary emboli.  Likely due to medication noncompliance.  Given the moderate to marked severity and bilateral nature, patient will require admission and IV heparin .  I discussed this plan with the patient who is in agreement.  Discussed case with hospitalist.  Stable at the time of admission.  Patient's case discussed with Dr. Dreama who agrees with plan to admit.   Note: Portions of this report may have been transcribed using voice recognition software. Every effort was made to ensure accuracy; however, inadvertent computerized transcription errors may still be present.         Final Clinical Impression(s) / ED Diagnoses Final diagnoses:  Multiple subsegmental pulmonary emboli without  acute cor pulmonale [facility])    Rx / DC Orders ED Discharge Orders     None         Edwardo Marsa CHRISTELLA DEVONNA 04/23/23 2348    Dreama Longs, MD 04/24/23 [address] Marsa CHRISTELLA, PA-C 04/24/23 2344    Dreama Longs, MD 04/25/23 1423

## 2023-04-24 ENCOUNTER — Encounter (HOSPITAL_COMMUNITY): Payer: Self-pay | Admitting: Internal Medicine

## 2023-04-24 DIAGNOSIS — Z1152 Encounter for screening for COVID-19: Secondary | ICD-10-CM | POA: Diagnosis not present

## 2023-04-24 DIAGNOSIS — K5090 Crohn's disease, unspecified, without complications: Secondary | ICD-10-CM | POA: Diagnosis not present

## 2023-04-24 DIAGNOSIS — R079 Chest pain, unspecified: Secondary | ICD-10-CM | POA: Diagnosis present

## 2023-04-24 DIAGNOSIS — Z79899 Other long term (current) drug therapy: Secondary | ICD-10-CM | POA: Diagnosis not present

## 2023-04-24 DIAGNOSIS — E039 Hypothyroidism, unspecified: Secondary | ICD-10-CM | POA: Diagnosis not present

## 2023-04-24 DIAGNOSIS — I2699 Other pulmonary embolism without acute cor pulmonale: Secondary | ICD-10-CM | POA: Diagnosis not present

## 2023-04-24 DIAGNOSIS — Z87891 Personal history of nicotine dependence: Secondary | ICD-10-CM | POA: Diagnosis not present

## 2023-04-24 DIAGNOSIS — J121 Respiratory syncytial virus pneumonia: Secondary | ICD-10-CM | POA: Diagnosis not present

## 2023-04-24 DIAGNOSIS — I2694 Multiple subsegmental pulmonary emboli without acute cor pulmonale: Secondary | ICD-10-CM | POA: Diagnosis not present

## 2023-04-24 DIAGNOSIS — J9601 Acute respiratory failure with hypoxia: Secondary | ICD-10-CM | POA: Diagnosis not present

## 2023-04-24 LAB — HEPARIN LEVEL (UNFRACTIONATED): Heparin Unfractionated: >1.1 [IU]/mL — ABNORMAL HIGH (ref 0.30–0.70)

## 2023-04-24 LAB — APTT
aPTT: 83 s — ABNORMAL HIGH (ref 24–36)
aPTT: 42 s — ABNORMAL HIGH (ref 24–36)
aPTT: 44 s — ABNORMAL HIGH (ref 24–36)

## 2023-04-24 LAB — TROPONIN I (HIGH SENSITIVITY): Troponin I (High Sensitivity): 4 ng/L (ref <18)

## 2023-04-24 LAB — BRAIN NATRIURETIC PEPTIDE: B Natriuretic Peptide: 19.8 pg/mL (ref 0.0–100.0)

## 2023-04-24 MED ORDER — FOLIC ACID 1 MG PO TABS
1.0000 mg | ORAL_TABLET | Freq: Every day | ORAL | Status: DC
  Administered 2023-04-24 – 2023-04-26 (×3): 1 mg via ORAL
  Filled 2023-04-24 (×3): qty 1

## 2023-04-24 MED ORDER — ACETAMINOPHEN 325 MG PO TABS
650.0000 mg | ORAL_TABLET | Freq: Four times a day (QID) | ORAL | Status: DC | PRN
  Administered 2023-04-24: 650 mg via ORAL
  Filled 2023-04-24: qty 2

## 2023-04-24 MED ORDER — SODIUM CHLORIDE 0.9 % IV BOLUS
250.0000 mL | INTRAVENOUS | Status: AC
  Administered 2023-04-24: 250 mL via INTRAVENOUS

## 2023-04-24 MED ORDER — ENOXAPARIN SODIUM 80 MG/0.8ML IJ SOSY
80.0000 mg | PREFILLED_SYRINGE | Freq: Two times a day (BID) | INTRAMUSCULAR | Status: DC
  Administered 2023-04-24 – 2023-04-25 (×2): 80 mg via SUBCUTANEOUS
  Filled 2023-04-24 (×2): qty 0.8

## 2023-04-24 MED ORDER — OXYCODONE-ACETAMINOPHEN 5-325 MG PO TABS: 1.0000 | ORAL_TABLET | Freq: Four times a day (QID) | ORAL | Status: DC | PRN

## 2023-04-24 MED ORDER — GUAIFENESIN-DM 100-10 MG/5ML PO SYRP
5.0000 mL | ORAL_SOLUTION | ORAL | Status: DC | PRN
  Administered 2023-04-24 – 2023-04-25 (×2): 5 mL via ORAL
  Filled 2023-04-24 (×2): qty 5

## 2023-04-24 MED ORDER — SULFASALAZINE 500 MG PO TABS
1000.0000 mg | ORAL_TABLET | Freq: Three times a day (TID) | ORAL | Status: DC
  Administered 2023-04-24 – 2023-04-26 (×6): 1000 mg via ORAL
  Filled 2023-04-24 (×8): qty 2

## 2023-04-24 MED ORDER — METHIMAZOLE 5 MG PO TABS
5.0000 mg | ORAL_TABLET | Freq: Every day | ORAL | Status: DC
  Administered 2023-04-24 – 2023-04-26 (×3): 5 mg via ORAL
  Filled 2023-04-24 (×3): qty 1

## 2023-04-24 NOTE — ED Notes (Signed)
 Called to give report on Pt. And the Nurse taking the Pt. Is busy

## 2023-04-24 NOTE — ED Notes (Signed)
 Called [facility] for transport to [facility] @11 :53am.  Spoke with [employer]

## 2023-04-24 NOTE — Progress Notes (Signed)
 PHARMACY - ANTICOAGULATION CONSULT NOTE  Pharmacy Consult for IV heparin   Indication: pulmonary embolus  No Known Allergies  Patient Measurements: Height: 6' (182.9 cm) Weight: 73.6 kg (162 lb 3.2 oz) IBW/kg (Calculated) : 77.6 Heparin  Dosing Weight: 77.3 kg  Vital Signs: Temp: 98.4 F (36.9 C) (01/14 1636) Temp Source: Oral (01/14 1636) BP: 135/83 (01/14 1636) Pulse Rate: 86 (01/14 1300)  Labs: Recent Labs    04/23/23 2043 04/23/23 2330 04/24/23 0119 04/24/23 0652 04/24/23 1442 04/24/23 1608  HGB 10.9*  --   --   --   --   --   HCT 33.7*  --   --   --   --   --   PLT 280  --   --   --   --   --   APTT  --   --   --  83* 42* 44*  HEPARINUNFRC  --   --   --   --   --  >1.10*  CREATININE 1.07  --   --   --   --   --   TROPONINIHS  --  3 4  --   --   --     Estimated Creatinine Clearance: 83.1 mL/min (by C-G formula based on SCr of 1.07 mg/dL).  Assessment: Victor Branch is a 53 y.o. year old male admitted on 04/23/2023 with moderate to marked severity bilateral lower lobe PE right greater than left (no R heart strain noted). Xarelto  prior to admission for hx of DVT 2018 (last dose 1/13 1730). Pt has missed several doses recently and often doesn't take it for weeks. Pharmacy consulted to dose heparin .   APTT is 44 and subtherapeutic on 1300 units/hr. Heparin  level is not correlating with aPTT. Discussed switching to enoxaparin  with MD, who agreed.  Goal of Therapy:  aPTT 66-102 seconds Heparin  level 0.3- 0.7 units/ml Monitor platelets by anticoagulation protocol: Yes   Plan:  Stop heparin   Start enoxaparin  80mg  q12 hr Monitor CBC, signs/symptoms of bleeding   Jinnie Door, PharmD, BCPS, BCCP Clinical Pharmacist  Please check AMION for all [facility] Pharmacy phone numbers After 10:00 PM, call Main Pharmacy [phone]

## 2023-04-24 NOTE — Progress Notes (Signed)
   04/24/23 1952  Assess: MEWS Score  Temp 99.7 F (37.6 C)  BP 124/71  MAP (mmHg) 87  Pulse Rate (!) 128  ECG Heart Rate (!) 127  Resp 20  Level of Consciousness Alert  SpO2 94 %  O2 Device Room Air  Assess: MEWS Score  MEWS Temp 0  MEWS Systolic 0  MEWS Pulse 2  MEWS RR 0  MEWS LOC 0  MEWS Score 2  MEWS Score Color Yellow  Assess: if the MEWS score is Yellow or Red  Were vital signs accurate and taken at a resting state? Yes  Does the patient meet 2 or more of the SIRS criteria? No  MEWS guidelines implemented  Yes, yellow  Treat  MEWS Interventions Considered administering scheduled or prn medications/treatments as ordered  Take Vital Signs  Increase Vital Sign Frequency  Yellow: Q2hr x1, continue Q4hrs until patient remains green for 12hrs  Escalate  MEWS: Escalate Yellow: Discuss with charge nurse and consider notifying provider and/or RRT  Notify: Charge Nurse/RN  Name of Charge Nurse/RN Notified Christina, RN  Provider Notification  Provider Name/Title Shona, DO  Date Provider Notified 04/24/23  Time Provider Notified 2008  Method of Notification Page  Notification Reason Change in status  Provider response See new orders  Date of Provider Response 04/24/23  Time of Provider Response 2010  Assess: SIRS CRITERIA  SIRS Temperature  0  SIRS Respirations  0  SIRS Pulse 1  SIRS WBC 0  SIRS Score Sum  1

## 2023-04-24 NOTE — Progress Notes (Signed)
 PHARMACY - ANTICOAGULATION CONSULT NOTE  Pharmacy Consult for IV heparin   Indication: pulmonary embolus  No Known Allergies  Patient Measurements: Height: 6' (182.9 cm) Weight: 77.3 kg (170 lb 8 oz) IBW/kg (Calculated) : 77.6 Heparin  Dosing Weight: 77.3 kg  Vital Signs: Temp: 99.4 F (37.4 C) (01/14 0558) Temp Source: Oral (01/14 0558) BP: 146/85 (01/14 0530) Pulse Rate: 75 (01/14 0500)  Labs: Recent Labs    04/23/23 2043 04/23/23 2330 04/24/23 0119 04/24/23 0652  HGB 10.9*  --   --   --   HCT 33.7*  --   --   --   PLT 280  --   --   --   APTT  --   --   --  83*  CREATININE 1.07  --   --   --   TROPONINIHS  --  3 4  --     Estimated Creatinine Clearance: 87.3 mL/min (by C-G formula based on SCr of 1.07 mg/dL).  Assessment: Victor Branch is a 53 y.o. year old male admitted on 04/23/2023 with concern for PE. CTA ab/pelv with  moderate to marked severity bilateral lower lobe PE right greater than left (no R heart strain noted). Xarelto  prior to admission (last dose 1/13 1730). Pt has missed several doses recently. Pharmacy consulted to dose heparin .   Initial aPTT is therapeutic at 83.   Goal of Therapy:  aPTT 66-102 seconds Monitor platelets by anticoagulation protocol: Yes   Plan:  Continue heparin  gtt 1300 units/hr Check a 6 hr aPTT to confirm dosing Daily aPTT, heparin  level and CBC  Vernell Meier, PharmD, BCPS, BCEMP Clinical Pharmacist Please see AMION for all pharmacy numbers 04/24/2023 7:44 AM

## 2023-04-24 NOTE — Plan of Care (Signed)
   Problem: Education: Goal: Knowledge of General Education information will improve Description Including pain rating scale, medication(s)/side effects and non-pharmacologic comfort measures Outcome: Progressing   Problem: Health Behavior/Discharge Planning: Goal: Ability to manage health-related needs will improve Outcome: Progressing

## 2023-04-24 NOTE — Progress Notes (Signed)
 Pt was resting in bed & O2 saturations began to drop below 92%, maintaining @ 90%. Placed pt on 2L [place] of supplemental O2, saturations now maintaining at 94%. Will continue to monitor.  Bari Edward, RN
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
## 2023-04-25 ENCOUNTER — Other Ambulatory Visit (HOSPITAL_COMMUNITY): Payer: Self-pay

## 2023-04-25 ENCOUNTER — Observation Stay (HOSPITAL_COMMUNITY): Payer: BC Managed Care – PPO

## 2023-04-25 ENCOUNTER — Telehealth (HOSPITAL_COMMUNITY): Payer: Self-pay | Admitting: Pharmacy Technician

## 2023-04-25 ENCOUNTER — Observation Stay (HOSPITAL_BASED_OUTPATIENT_CLINIC_OR_DEPARTMENT_OTHER): Payer: BC Managed Care – PPO

## 2023-04-25 DIAGNOSIS — E0590 Thyrotoxicosis, unspecified without thyrotoxic crisis or storm: Secondary | ICD-10-CM

## 2023-04-25 DIAGNOSIS — K5090 Crohn's disease, unspecified, without complications: Secondary | ICD-10-CM | POA: Diagnosis present

## 2023-04-25 DIAGNOSIS — K5000 Crohn's disease of small intestine without complications: Secondary | ICD-10-CM | POA: Diagnosis not present

## 2023-04-25 DIAGNOSIS — E039 Hypothyroidism, unspecified: Secondary | ICD-10-CM | POA: Insufficient documentation

## 2023-04-25 DIAGNOSIS — M7989 Other specified soft tissue disorders: Secondary | ICD-10-CM | POA: Diagnosis not present

## 2023-04-25 DIAGNOSIS — I2699 Other pulmonary embolism without acute cor pulmonale: Secondary | ICD-10-CM | POA: Diagnosis not present

## 2023-04-25 DIAGNOSIS — I2694 Multiple subsegmental pulmonary emboli without acute cor pulmonale: Secondary | ICD-10-CM | POA: Diagnosis not present

## 2023-04-25 LAB — COMPREHENSIVE METABOLIC PANEL
ALT: 17 U/L (ref 0–44)
AST: 12 U/L — ABNORMAL LOW (ref 15–41)
Albumin: 2.6 g/dL — ABNORMAL LOW (ref 3.5–5.0)
Alkaline Phosphatase: 85 U/L (ref 38–126)
Anion gap: 8 (ref 5–15)
BUN: 9 mg/dL (ref 6–20)
CO2: 22 mmol/L (ref 22–32)
Calcium: 8.7 mg/dL — ABNORMAL LOW (ref 8.9–10.3)
Chloride: 106 mmol/L (ref 98–111)
Creatinine, Ser: 1.01 mg/dL (ref 0.61–1.24)
GFR, Estimated: >60 mL/min (ref >60)
Glucose, Bld: 92 mg/dL (ref 70–99)
Potassium: 4.1 mmol/L (ref 3.5–5.1)
Sodium: 136 mmol/L (ref 135–145)
Total Bilirubin: 1 mg/dL (ref 0.0–1.2)
Total Protein: 5.8 g/dL — ABNORMAL LOW (ref 6.5–8.1)

## 2023-04-25 LAB — RESPIRATORY PANEL BY PCR

## 2023-04-25 LAB — ECHOCARDIOGRAM COMPLETE
Area-P 1/2: 5.66 cm2
Calc EF: 58.4 %
Height: 72 in
S' Lateral: 2.9 cm
Single Plane A2C EF: 56.5 %
Single Plane A4C EF: 61.1 %
Weight: 2595.2 [oz_av]

## 2023-04-25 LAB — CBC
HCT: 36.5 % — ABNORMAL LOW (ref 39.0–52.0)
Hemoglobin: 11.8 g/dL — ABNORMAL LOW (ref 13.0–17.0)
MCH: 27.3 pg (ref 26.0–34.0)
MCHC: 32.3 g/dL (ref 30.0–36.0)
MCV: 84.3 fL (ref 80.0–100.0)
Platelets: 282 10*3/uL (ref 150–400)
RBC: 4.33 MIL/uL (ref 4.22–5.81)
RDW: 14.1 % (ref 11.5–15.5)
WBC: 8.1 10*3/uL (ref 4.0–10.5)
nRBC: 0 % (ref 0.0–0.2)

## 2023-04-25 LAB — HEPARIN ANTI-XA: Heparin LMW: 0.81 [IU]/mL

## 2023-04-25 LAB — PROTIME-INR
INR: >10 (ref 0.8–1.2)
INR: 1.2 (ref 0.8–1.2)
Prothrombin Time: >90 s — ABNORMAL HIGH (ref 11.4–15.2)
Prothrombin Time: 14.9 s (ref 11.4–15.2)

## 2023-04-25 LAB — SARS CORONAVIRUS 2 BY RT PCR: SARS Coronavirus 2 by RT PCR: NEGATIVE

## 2023-04-25 MED ORDER — RIVAROXABAN 15 MG PO TABS
15.0000 mg | ORAL_TABLET | Freq: Two times a day (BID) | ORAL | Status: DC
  Administered 2023-04-25 – 2023-04-26 (×2): 15 mg via ORAL
  Filled 2023-04-25 (×2): qty 1

## 2023-04-25 MED ORDER — RIVAROXABAN 20 MG PO TABS: 20.0000 mg | ORAL_TABLET | Freq: Every day | ORAL | Status: DC

## 2023-04-25 NOTE — Discharge Instructions (Signed)
 Information on my medicine - XARELTO  (rivaroxaban )  This medication education was reviewed with me or my healthcare representative as part of my discharge preparation.  The pharmacist that spoke with me during my hospital stay was:  Aldon Hung, RPH  WHY WAS XARELTO  PRESCRIBED FOR YOU? Xarelto  was prescribed to treat blood clots that may have been found in the veins of your legs (deep vein thrombosis) or in your lungs (pulmonary embolism) and to reduce the risk of them occurring again.  What do you need to know about Xarelto ? The starting dose is one 15 mg tablet taken TWICE daily with food for the FIRST 21 DAYS then on (enter date)  ***  the dose is changed to one 20 mg tablet taken ONCE A DAY with your evening meal.  DO NOT stop taking Xarelto  without talking to the health care provider who prescribed the medication.  Refill your prescription for 20 mg tablets before you run out.  After discharge, you should have regular check-up appointments with your healthcare provider that is prescribing your Xarelto .  In the future your dose may need to be changed if your kidney function changes by a significant amount.  What do you do if you miss a dose? If you are taking Xarelto  TWICE DAILY and you miss a dose, take it as soon as you remember. You may take two 15 mg tablets (total 30 mg) at the same time then resume your regularly scheduled 15 mg twice daily the next day.  If you are taking Xarelto  ONCE DAILY and you miss a dose, take it as soon as you remember on the same day then continue your regularly scheduled once daily regimen the next day. Do not take two doses of Xarelto  at the same time.   Important Safety Information Xarelto  is a blood thinner medicine that can cause bleeding. You should call your healthcare provider right away if you experience any of the following: Bleeding from an injury or your nose that does not stop. Unusual colored urine (red or dark brown) or unusual  colored stools (red or black). Unusual bruising for unknown reasons. A serious fall or if you hit your head (even if there is no bleeding).  Some medicines may interact with Xarelto  and might increase your risk of bleeding while on Xarelto . To help avoid this, consult your healthcare provider or pharmacist prior to using any new prescription or non-prescription medications, including herbals, vitamins, non-steroidal anti-inflammatory drugs (NSAIDs) and supplements.  This website has more information on Xarelto : [email or web address] .com.

## 2023-04-25 NOTE — Progress Notes (Signed)
 [facility]                                                                              Victor Branch, is a 53 y.o. male, DOB - Nov 22, 1970, GMW:[id number] Admit date - 04/23/2023    Outpatient Primary MD for the patient is Lavenia Post., MD  LOS - 0  days  Chief Complaint  Patient presents with   Back Pain       Brief summary   Patient is a 53 year old male with prior history of DVT, on Xarelto  since 2018 however noncompliant, hypothyroidism, Crohn's disease presented with shortness of breath, dyspnea on exertion for the last 5 days.  Reports not feeling himself for the last 2 to 3 weeks with 1 episode of emesis in the shower and feeling weak and shortness of breath with regular activity. CTA chest showed moderate to marked severity bilateral lower lobar pulmonary emboli, right greater than left.  Assessment & Plan    Principal Problem: Moderate to marked severity bilateral pulmonary emboli (HCC) -Initially placed on heparin  drip then subsequently transition to subcutaneous Lovenox  -Venous Dopplers lower extremity showed no DVT in the lower extremities -2D echo showed EF of 55 to 60%, no regional wall motion abnormalities, right ventricular systolic function normal -INR this morning > 10.  Lovenox  was held, received dose at 5:45 AM.  INR repeated again this afternoon 1.2. ?  Lab error -Discussed in detail with the patient regarding the DOAC's, now with bilateral pulmonary embolism, stressed the importance of anticoagulation.  He wants to stay with Xarelto . -Insurance co-pay checked.  Discussed with pharmacist, will place on Xarelto  today.  -O2 sats 91 to 96% on 2 L O2 via [place].  Home O2 evaluation prior to discharge.  BP borderline with tachycardia.  Active Problems:   Crohn disease (HCC) -Continue sulfasalazine     Hyperthyroidism -Continue methimazole    Estimated body mass index is 22 kg/m as calculated from the following:   Height as of this encounter:  6' (1.829 m).   Weight as of this encounter: 73.6 kg.  Code Status: Full code DVT Prophylaxis:  Lovenox , plan to start Xarelto  today   Level of Care: Level of care: Telemetry Cardiac Family Communication: Updated patient Disposition Plan:      Remains inpatient appropriate: Plan to start Xarelto  today.  Given hypoxia, borderline BP with tachycardia, will observe overnight.  If no acute issues, plan to DC tomorrow.   Procedures:  2D echo Venous Dopplers lower extremities  Consultants:     Antimicrobials:   Anti-infectives (From admission, onward)    None          Medications  folic acid   1 mg Oral Daily   methimazole   5 mg Oral Daily   sulfaSALAzine   1,000 mg Oral TID      Subjective:   Victor Branch was seen and examined today.  No acute complaints.  Currently no acute chest pain, shortness of breath at rest.  No fevers or chills.  No nausea.   Objective:   Vitals:   04/24/23 2313 04/25/23 0503 04/25/23 0748 04/25/23 1153  BP: 125/83 115/79 123/85 (!) 89/73  Pulse: (!) 108 79    Resp: 20 18 18 18   Temp: 99.6 F (37.6 C) 98.5 F (36.9 C) 98.3 F (36.8 C) 98.2 F (36.8 C)  TempSrc: Oral Oral Oral Oral  SpO2: 94% 96%    Weight:      Height:        Intake/Output Summary (Last 24 hours) at 04/25/2023 1519 Last data filed at 04/25/2023 0924 Gross per 24 hour  Intake 804.27 ml  Output 2 ml  Net 802.27 ml     Wt Readings from Last 3 Encounters:  04/24/23 73.6 kg  09/29/16 70.8 kg  09/28/16 70.8 kg     Exam General: Alert and oriented x 3, NAD Cardiovascular: S1 S2 auscultated,  RRR Respiratory: Clear to auscultation bilaterally, no wheezing Gastrointestinal: Soft, nontender, nondistended, + bowel sounds Ext: no pedal edema bilaterally Neuro: no new deficits Psych: Normal affect     Data Reviewed:  I have personally reviewed following labs    CBC Lab Results  Component Value Date   WBC 8.1 04/25/2023   RBC 4.33 04/25/2023   HGB 11.8  (L) 04/25/2023   HCT 36.5 (L) 04/25/2023   MCV 84.3 04/25/2023   MCH 27.3 04/25/2023   PLT 282 04/25/2023   MCHC 32.3 04/25/2023   RDW 14.1 04/25/2023   LYMPHSABS 1.9 09/29/2016   MONOABS 0.8 09/29/2016   EOSABS 0.0 09/29/2016   BASOSABS 0.0 09/29/2016     Last metabolic panel Lab Results  Component Value Date   NA 136 04/25/2023   K 4.1 04/25/2023   CL 106 04/25/2023   CO2 22 04/25/2023   BUN 9 04/25/2023   CREATININE 1.01 04/25/2023   GLUCOSE 92 04/25/2023   GFRNONAA >60 04/25/2023   GFRAA >60 09/29/2016   CALCIUM 8.7 (L) 04/25/2023   PROT 5.8 (L) 04/25/2023   ALBUMIN 2.6 (L) 04/25/2023   BILITOT 1.0 04/25/2023   ALKPHOS 85 04/25/2023   AST 12 (L) 04/25/2023   ALT 17 04/25/2023   ANIONGAP 8 04/25/2023    CBG (last 3)  No results for input(s): "GLUCAP" in the last 72 hours.    Coagulation Profile: Recent Labs  Lab 04/25/23 0459 04/25/23 1337  INR >10.0* 1.2     Radiology Studies: I have personally reviewed the imaging studies  VAS US  LOWER EXTREMITY VENOUS (DVT) Result Date: 04/25/2023  Lower Venous DVT Study Patient Name:  Victor Branch  Date of Exam:   04/25/2023 Medical Rec #: [id number]      Accession #:    [id number] Date of Birth: 04-21-1970      Patient Gender: M Patient Age:   23 years Exam Location:  [facility] Procedure:      VAS US  LOWER EXTREMITY VENOUS (DVT) Referring Phys: Victor Branch --------------------------------------------------------------------------------  Indications: Pulmonary embolism.  Limitations: Xarelto  (noncompliant with medication). Comparison       Previous exam on 09/29/2016 was positive for DVT in RLE (FV & Study:           PopV) Performing Technologist: Arlyce Berger RVT, RDMS  Examination Guidelines: A complete evaluation includes B-mode imaging, spectral Doppler, color Doppler, and power Doppler as needed of all accessible portions of each vessel. Bilateral testing is considered an integral part of a complete  examination. Limited examinations for reoccurring indications may be performed as noted. The reflux portion of the exam is performed with the patient in reverse Trendelenburg.  +---------+---------------+---------+-----------+----------+--------------+ RIGHT    CompressibilityPhasicitySpontaneityPropertiesThrombus Aging +---------+---------------+---------+-----------+----------+--------------+ CFV      Full  Yes      Yes                                 +---------+---------------+---------+-----------+----------+--------------+ SFJ      Full                                                        +---------+---------------+---------+-----------+----------+--------------+ FV Prox  Full           Yes      Yes                                 +---------+---------------+---------+-----------+----------+--------------+ FV Mid   Full           Yes      Yes                                 +---------+---------------+---------+-----------+----------+--------------+ FV DistalFull           Yes      Yes                                 +---------+---------------+---------+-----------+----------+--------------+ PFV      Full                                                        +---------+---------------+---------+-----------+----------+--------------+ POP      Full           Yes      Yes                                 +---------+---------------+---------+-----------+----------+--------------+ PTV      Full                                                        +---------+---------------+---------+-----------+----------+--------------+ PERO     Full                                                        +---------+---------------+---------+-----------+----------+--------------+   +---------+---------------+---------+-----------+----------+--------------+ LEFT     CompressibilityPhasicitySpontaneityPropertiesThrombus Aging  +---------+---------------+---------+-----------+----------+--------------+ CFV      Full           Yes      Yes                                 +---------+---------------+---------+-----------+----------+--------------+ SFJ      Full                                                        +---------+---------------+---------+-----------+----------+--------------+  FV Prox  Full           Yes      Yes                                 +---------+---------------+---------+-----------+----------+--------------+ FV Mid   Full           Yes      Yes                                 +---------+---------------+---------+-----------+----------+--------------+ FV DistalFull           Yes      Yes                                 +---------+---------------+---------+-----------+----------+--------------+ PFV      Full                                                        +---------+---------------+---------+-----------+----------+--------------+ POP      Full           Yes      Yes                                 +---------+---------------+---------+-----------+----------+--------------+ PTV      Full                                                        +---------+---------------+---------+-----------+----------+--------------+ PERO     Full                                                        +---------+---------------+---------+-----------+----------+--------------+    Summary: BILATERAL: - No evidence of deep vein thrombosis seen in the lower extremities, bilaterally. -No evidence of popliteal cyst, bilaterally.   *See table(s) above for measurements and observations.    Preliminary    ECHOCARDIOGRAM COMPLETE Result Date: 04/25/2023    ECHOCARDIOGRAM REPORT   Patient Name:   Victor Branch Date of Exam: 04/25/2023 Medical Rec #:  [id number]     Height:       72.0 in Accession #:    [id number]    Weight:       162.2 lb Date of Birth:  [birth year]     BSA:           1.949 m Patient Age:    52 years      BP:           134/76 mmHg Patient Gender: M             HR:           91 bpm. Exam Location:  Inpatient Procedure: 2D Echo, Cardiac Doppler, Color Doppler and Strain Analysis Indications:    Pulmonary Embolism  History:        Patient has no prior history of Echocardiogram examinations.                 Pulmonary Embolism.  Sonographer:    Calleen Catena Referring Phys: [id number] SAMTANI IMPRESSIONS  1. Left ventricular ejection fraction, by estimation, is 55 to 60%. The left ventricle has normal function. The left ventricle has no regional wall motion abnormalities. Left ventricular diastolic parameters were normal.  2. Right ventricular systolic function is normal. The right ventricular size is normal. Tricuspid regurgitation signal is inadequate for assessing PA pressure.  3. A small pericardial effusion is present.  4. The mitral valve is normal in structure. No evidence of mitral valve regurgitation.  5. The aortic valve was not well visualized. Aortic valve regurgitation is not visualized. No aortic stenosis is present.  6. The inferior vena cava is dilated in size with >50% respiratory variability, suggesting right atrial pressure of 8 mmHg. FINDINGS  Left Ventricle: Left ventricular ejection fraction, by estimation, is 55 to 60%. The left ventricle has normal function. The left ventricle has no regional wall motion abnormalities. The left ventricular internal cavity size was normal in size. There is  no left ventricular hypertrophy. Left ventricular diastolic parameters were normal. Right Ventricle: The right ventricular size is normal. No increase in right ventricular wall thickness. Right ventricular systolic function is normal. Tricuspid regurgitation signal is inadequate for assessing PA pressure. Left Atrium: Left atrial size was normal in size. Right Atrium: Right atrial size was normal in size. Pericardium: A small pericardial effusion is present. Mitral  Valve: The mitral valve is normal in structure. No evidence of mitral valve regurgitation. Tricuspid Valve: The tricuspid valve is normal in structure. Tricuspid valve regurgitation is trivial. Aortic Valve: The aortic valve was not well visualized. Aortic valve regurgitation is not visualized. No aortic stenosis is present. Pulmonic Valve: The pulmonic valve was not well visualized. Pulmonic valve regurgitation is not visualized. Aorta: The aortic root and ascending aorta are structurally normal, with no evidence of dilitation. Venous: The inferior vena cava is dilated in size with greater than 50% respiratory variability, suggesting right atrial pressure of 8 mmHg. IAS/Shunts: The interatrial septum was not well visualized.  LEFT VENTRICLE PLAX 2D LVIDd:         3.90 cm     Diastology LVIDs:         2.90 cm     LV e' medial:    7.77 cm/s LV PW:         1.00 cm     LV E/e' medial:  6.9 LV IVS:        1.10 cm     LV e' lateral:   9.48 cm/s LVOT diam:     2.20 cm     LV E/e' lateral: 5.7 LV SV:         58 LV SV Index:   30 LVOT Area:     3.80 cm  LV Volumes (MOD) LV vol d, MOD A2C: 54.3 ml LV vol d, MOD A4C: 53.2 ml LV vol s, MOD A2C: 23.6 ml LV vol s, MOD A4C: 20.7 ml LV SV MOD A2C:     30.7 ml LV SV MOD A4C:     53.2 ml LV SV MOD BP:      31.7 ml RIGHT VENTRICLE             IVC RV Basal diam:  2.90 cm  IVC diam: 2.20 cm RV S prime:     14.10 cm/s TAPSE (M-mode): 2.2 cm LEFT ATRIUM           Index        RIGHT ATRIUM           Index LA diam:      2.40 cm 1.23 cm/m   RA Area:     10.50 cm LA Vol (A2C): 27.6 ml 14.16 ml/m  RA Volume:   19.30 ml  9.90 ml/m LA Vol (A4C): 13.5 ml 6.93 ml/m  AORTIC VALVE LVOT Vmax:   105.00 cm/s LVOT Vmean:  72.000 cm/s LVOT VTI:    0.152 m  AORTA Ao Root diam: 3.50 cm Ao Asc diam:  3.30 cm MITRAL VALVE MV Area (PHT): 5.66 cm    SHUNTS MV Decel Time: 134 msec    Systemic VTI:  0.15 m MV E velocity: 53.60 cm/s  Systemic Diam: 2.20 cm MV A velocity: 69.40 cm/s MV E/A ratio:  0.77  Carson Clara MD Electronically signed by Carson Clara MD Signature Date/Time: 04/25/2023/11:18:55 AM    Final    CT Angio Chest PE W/Cm &/Or Wo Cm Result Date: 04/23/2023 CLINICAL DATA:  Right subscapular back pain. EXAM: CT ANGIOGRAPHY CHEST WITH CONTRAST TECHNIQUE: Multidetector CT imaging of the chest was performed using the standard protocol during bolus administration of intravenous contrast. Multiplanar CT image reconstructions and MIPs were obtained to evaluate the vascular anatomy. RADIATION DOSE REDUCTION: This exam was performed according to the departmental dose-optimization program which includes automated exposure control, adjustment of the mA and/or kV according to patient size and/or use of iterative reconstruction technique. CONTRAST:  75mL OMNIPAQUE  IOHEXOL  350 MG/ML SOLN COMPARISON:  July 22, 2019 FINDINGS: Cardiovascular: The thoracic aorta is normal in appearance. Satisfactory opacification of the pulmonary arteries to the segmental level. Moderate to marked severity areas of intraluminal low attenuation are seen involving multiple lower lobe branches of the right pulmonary artery. Mild involvement of lower lobe branches of the left pulmonary artery is also seen. Normal heart size without evidence of right heart strain (RV/LV ratio of 0.84). No pericardial effusion. Mediastinum/Nodes: No enlarged mediastinal, hilar, or axillary lymph nodes. Thyroid gland, trachea, and esophagus demonstrate no significant findings. Lungs/Pleura: Mild scarring and/or atelectasis is seen within the right apex. Moderate severity areas of scarring, atelectasis and/or infiltrate are seen within the bilateral lung bases. There is a very small right pleural effusion. No pneumothorax is identified. Upper Abdomen: No acute abnormality. Musculoskeletal: No chest wall abnormality. No acute or significant osseous findings. Review of the MIP images confirms the above findings. IMPRESSION: 1. Moderate to  marked severity bilateral lower lobe pulmonary emboli, right greater than left. 2. Moderate severity bibasilar scarring, atelectasis and/or infiltrate. 3. Very small right pleural effusion. Electronically Signed   By: Virgle Grime M.D.   On: 04/23/2023 23:10       Bao Coreas M.D. [facility] 04/25/2023, 3:19 PM  Available via Epic secure chat 7am-7pm After 7 pm, please refer to night coverage provider listed on amion.

## 2023-04-25 NOTE — Progress Notes (Signed)
 Mobility Specialist Progress Note;   04/25/23 1035  Mobility  Activity Ambulated with assistance in hallway  Level of Assistance Standby assist, set-up cues, supervision of patient - no hands on  Assistive Device None  Distance Ambulated (ft) 400 ft  Activity Response Tolerated well  Mobility Referral Yes  Mobility visit 1 Mobility  Mobility Specialist Start Time (ACUTE ONLY) 1035  Mobility Specialist Stop Time (ACUTE ONLY) 1045  Mobility Specialist Time Calculation (min) (ACUTE ONLY) 10 min   Pt agreeable to mobility. On 1LO2 upon arrival. Required no physical assistance during ambulation, SV. Upon standing, HR increased to 140s, however stayed within 120s during ambulation. Ambulated on RA, VSS. No c/o during session. Pt returned to EOB w/ all needs met, on RA. RN notified.   Janit Meline Mobility Specialist Please contact via SecureChat or [employer] [phone]

## 2023-04-25 NOTE — Progress Notes (Signed)
 BLE venous duplex has been completed.   Results can be found under chart review under CV PROC. 04/25/2023 12:34 PM Jinnie Onley RVT, RDMS

## 2023-04-25 NOTE — Telephone Encounter (Signed)
 Patient Product/process development scientist completed.    The patient is insured through [facility]. Patient has [employer], may use a copay card, and/or apply for patient assistance if available.    Ran test claim for Eliquis 5 mg and the current 30 day co-pay is $115.70.  Ran test claim for Xarelto  20 mg and the current 30 day co-pay is $108.70.  This test claim was processed through [facility]- copay amounts may vary at other pharmacies due to pharmacy/plan contracts, or as the patient moves through the different stages of their insurance plan.     Morgan Arab, CPHT Pharmacy Technician III Certified Patient Advocate [facility] Patient Advocate Team Direct Number: [phone]  Fax: [phone]

## 2023-04-25 NOTE — Plan of Care (Signed)

## 2023-04-25 NOTE — Progress Notes (Deleted)
 This RN entered pt's room.  Pt is asleep on RA, oxygen is ranging from 81-85% RA

## 2023-04-26 ENCOUNTER — Other Ambulatory Visit (HOSPITAL_COMMUNITY): Payer: Self-pay

## 2023-04-26 DIAGNOSIS — J121 Respiratory syncytial virus pneumonia: Secondary | ICD-10-CM

## 2023-04-26 DIAGNOSIS — K5000 Crohn's disease of small intestine without complications: Secondary | ICD-10-CM | POA: Diagnosis not present

## 2023-04-26 DIAGNOSIS — E0590 Thyrotoxicosis, unspecified without thyrotoxic crisis or storm: Secondary | ICD-10-CM | POA: Diagnosis not present

## 2023-04-26 DIAGNOSIS — I2699 Other pulmonary embolism without acute cor pulmonale: Secondary | ICD-10-CM | POA: Diagnosis not present

## 2023-04-26 DIAGNOSIS — I2694 Multiple subsegmental pulmonary emboli without acute cor pulmonale: Secondary | ICD-10-CM | POA: Diagnosis not present

## 2023-04-26 LAB — BASIC METABOLIC PANEL
Anion gap: 7 (ref 5–15)
BUN: 10 mg/dL (ref 6–20)
CO2: 21 mmol/L — ABNORMAL LOW (ref 22–32)
Calcium: 8.5 mg/dL — ABNORMAL LOW (ref 8.9–10.3)
Chloride: 104 mmol/L (ref 98–111)
Creatinine, Ser: 1 mg/dL (ref 0.61–1.24)
GFR, Estimated: >60 mL/min (ref >60)
Glucose, Bld: 103 mg/dL — ABNORMAL HIGH (ref 70–99)
Potassium: 4 mmol/L (ref 3.5–5.1)
Sodium: 132 mmol/L — ABNORMAL LOW (ref 135–145)

## 2023-04-26 LAB — CBC
HCT: 37.5 % — ABNORMAL LOW (ref 39.0–52.0)
Hemoglobin: 12.2 g/dL — ABNORMAL LOW (ref 13.0–17.0)
MCH: 27.5 pg (ref 26.0–34.0)
MCHC: 32.5 g/dL (ref 30.0–36.0)
MCV: 84.7 fL (ref 80.0–100.0)
Platelets: 289 10*3/uL (ref 150–400)
RBC: 4.43 MIL/uL (ref 4.22–5.81)
RDW: 14.2 % (ref 11.5–15.5)
WBC: 7.8 10*3/uL (ref 4.0–10.5)
nRBC: 0 % (ref 0.0–0.2)

## 2023-04-26 MED ORDER — GUAIFENESIN ER 600 MG PO TB12: 600.0000 mg | ORAL_TABLET | Freq: Two times a day (BID) | ORAL | Status: DC

## 2023-04-26 MED ORDER — BENZONATATE 200 MG PO CAPS
200.0000 mg | ORAL_CAPSULE | Freq: Three times a day (TID) | ORAL | 0 refills | Status: AC | PRN
  Filled 2023-04-26: qty 60, 20d supply, fill #0

## 2023-04-26 MED ORDER — GUAIFENESIN ER 600 MG PO TB12
600.0000 mg | ORAL_TABLET | Freq: Two times a day (BID) | ORAL | 0 refills | Status: AC
  Filled 2023-04-26: qty 28, 14d supply, fill #0

## 2023-04-26 MED ORDER — GUAIFENESIN-DM 100-10 MG/5ML PO SYRP
5.0000 mL | ORAL_SOLUTION | ORAL | 0 refills | Status: AC | PRN
  Filled 2023-04-26: qty 237, 8d supply, fill #0

## 2023-04-26 MED ORDER — METHIMAZOLE 5 MG PO TABS
5.0000 mg | ORAL_TABLET | Freq: Every day | ORAL | 3 refills | Status: AC
  Filled 2023-04-26: qty 30, 30d supply, fill #0

## 2023-04-26 MED ORDER — RIVAROXABAN 20 MG PO TABS
20.0000 mg | ORAL_TABLET | Freq: Every day | ORAL | 4 refills | Status: AC
  Filled 2023-04-26: qty 30, 30d supply, fill #0

## 2023-04-26 MED ORDER — RIVAROXABAN (XARELTO) VTE STARTER PACK (15 & 20 MG)
ORAL_TABLET | ORAL | 0 refills | Status: AC
  Filled 2023-04-26: qty 51, 30d supply, fill #0

## 2023-04-26 MED ORDER — BENZONATATE 100 MG PO CAPS: 200.0000 mg | ORAL_CAPSULE | Freq: Three times a day (TID) | ORAL | Status: DC

## 2023-04-26 NOTE — Progress Notes (Signed)
SATURATION QUALIFICATIONS: (This note is used to comply with regulatory documentation for home oxygen)  Patient Saturations on Room Air at Rest = 95%  Patient Saturations on Room Air while Ambulating = 95%  Patient Saturations on NA Liters of oxygen while Ambulating = NA%  Please briefly explain why patient needs home oxygen: Does not meet qualifications

## 2023-04-26 NOTE — TOC CM/SW Note (Signed)
Transition of Care [facility]) - Inpatient Brief Assessment   Patient Details  Name: Victor Branch MRN: [id number] Date of Birth: July 09, 1970  Transition of Care [facility]) CM/SW Contact:    Gala Lewandowsky, RN Phone Number: 04/26/2023, 11:40 AM   Clinical Narrative: Carlena Hurl co pay card to be given to patient via staff RN. Patient did not qualify for home oxygen. No further home needs identified.    Transition of Care Asessment: Insurance and Status: Insurance coverage has been reviewed Patient has primary care physician: Yes Prior/Current Home Services: No current home services Social Drivers of Health Review: SDOH reviewed no interventions necessary Readmission risk has been reviewed: Yes Transition of care needs: no transition of care needs at this time

## 2023-04-26 NOTE — Discharge Summary (Signed)
Physician Discharge Summary   Patient: Victor Branch MRN: [id number] DOB: Oct 03, 1970  Admit date:     04/23/2023  Discharge date: 04/26/23  Discharge Physician: Thad Ranger, MD    PCP: Andreas Blower., MD   Recommendations at discharge:   Continue Xarelto therapeutic dose 15 mg twice daily for 21 days, then on 05/16/2023, change to 20 mg daily.    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  Reports not feeling himself for the last 2 to 3 weeks with 1 episode of emesis in the shower and feeling weak and shortness of breath with regular activity. CTA chest showed moderate to marked severity bilateral lower lobar pulmonary emboli, right greater than left.  Assessment and Plan:  Moderate to marked severity bilateral pulmonary emboli (HCC) Acute respiratory failure with hypoxia -Initially placed on heparin drip then subsequently transition to subcutaneous Lovenox -Venous Dopplers lower extremity showed no DVT in the lower extremities -2D echo showed EF of 55 to 60%, no regional wall motion abnormalities, right ventricular systolic function normal -Discussed in detail with the patient regarding the DOAC's, now with bilateral pulmonary embolism, stressed the importance of anticoagulation.  He wants to stay with Xarelto. -Insurance co-pay checked, started on Xarelto 04/25/2023 evening. -O2 sats 93 to 99% on 2 L O2 via [place] at rest.  Not on home O2 at baseline.  Will obtain ambulatory home O2 evaluation prior to discharge.      RSV viral illness -COVID-19, flu negative, respiratory virus panel showed RSV+ -Continue isolation, Tessalon Perles, Mucinex,  Robitussin for cough. -No fevers, overall improving    Crohn disease (HCC) -Continue sulfasalazine     Hyperthyroidism -Continue methimazole     Estimated body mass index is 22 kg/m as calculated from the following:   Height as of this encounter: 6' (1.829 m).   Weight as of this encounter: 73.6 kg.     Pain control - [employer] Controlled Substance Reporting System database was reviewed. and patient was instructed, not to drive, operate heavy machinery, perform activities at heights, swimming or participation in water activities or provide baby-sitting services while on Pain, Sleep and Anxiety Medications; until their outpatient Physician has advised to do so again. Also recommended to not to take more than prescribed Pain, Sleep and Anxiety Medications.  Consultants: None Procedures performed: 2D echo, venous Dopplers bilateral lower extremity Disposition: Home Diet recommendation:  Discharge Diet Orders (From admission, onward)     Start     Ordered   04/26/23 0000  Diet - low sodium heart healthy        04/26/23 1050            DISCHARGE MEDICATION: Allergies as of 04/26/2023   No Known Allergies      Medication List     TAKE these medications    benzonatate 200 MG capsule Commonly known as: TESSALON Take 1 capsule (200 mg total) by mouth 3 (three) times daily as needed for cough.   folic acid 1 MG tablet Commonly known as: FOLVITE Take 1 mg by mouth daily.   guaiFENesin 600 MG 12 hr tablet Commonly known as: MUCINEX Take 1 tablet (600 mg total) by mouth  2 (two) times daily for 14 days.   guaiFENesin-dextromethorphan 100-10 MG/5ML syrup Commonly known as: ROBITUSSIN DM Take 5 mLs by mouth every 4 (four) hours as needed for cough.   methimazole 5 MG tablet Commonly known as: TAPAZOLE Take 1 tablet (5 mg total) by mouth daily.   Rivaroxaban Starter Pack (15 mg and 20 mg) Commonly known as: XARELTO STARTER PACK Follow package directions: Take one  15mg  tablet by mouth twice a day. On day 22 (05/16/2023), switch to one 20mg  tablet once a day. Take with food.   rivaroxaban 20 MG Tabs tablet Commonly known as: XARELTO Take 1 tablet (20 mg total) by mouth daily with supper. Start this prescription after you have completed the starter pack of Xarelto. Start taking on: May 16, 2023   sulfaSALAzine 500 MG tablet Commonly known as: AZULFIDINE Take 1,000 mg by mouth 3 (three) times daily.        Follow-up Information     Andreas Blower., MD. Schedule an appointment as soon as possible for a visit in 2 week(s).   Specialty: Internal Medicine Why: for hospital follow-up Contact information: [address] [place] [address] [phone]                Discharge Exam: Ceasar Mons Weights   04/23/23 2038 04/24/23 1432  Weight: 77.3 kg 73.6 kg   S: No acute complaints, still has coughing but no fevers, chest pain.  States was able to ambulate yesterday in the hallway.  BP 119/80 (BP Location: Right Arm)   Pulse 80   Temp 98.5 F (36.9 C) (Oral)   Resp 16   Ht 6' (1.829 m)   Wt 73.6 kg   SpO2 94%   BMI 22.00 kg/m   Physical Exam General: Alert and oriented x 3, NAD Cardiovascular: S1 S2 clear, RRR.  Respiratory: CTAB, no wheezing Gastrointestinal: Soft, nontender, nondistended, NBS Ext: no pedal edema bilaterally Neuro: no new deficits Psych: Normal affect     Condition at discharge: fair  The results of significant diagnostics from this hospitalization (including imaging, microbiology, ancillary and laboratory) are listed below for reference.   Imaging Studies: VAS [place] LOWER EXTREMITY VENOUS (DVT) Result Date: 04/25/2023  Lower Venous DVT Study Patient Name:  Victor Branch  Date of Exam:   04/25/2023 Medical Rec #: [id number]      Accession #:    [id number] Date of Birth: 07/04/1970      Patient Gender: M Patient Age:   68 years Exam Location:  [facility] Procedure:      VAS [place] LOWER  EXTREMITY VENOUS (DVT) Referring Phys: Rhetta Mura --------------------------------------------------------------------------------  Indications: Pulmonary embolism.  Limitations: Xarelto (noncompliant with medication). Comparison       Previous exam on 09/29/2016 was positive for DVT in RLE (FV & Study:           PopV) Performing Technologist: Ernestene Mention RVT, RDMS  Examination Guidelines: A complete evaluation includes B-mode imaging, spectral Doppler, color Doppler, and power Doppler as needed of all accessible portions of each vessel. Bilateral testing is considered an integral part of a complete examination. Limited examinations for reoccurring indications may be performed as noted. The reflux portion of the exam is performed with the patient in reverse Trendelenburg.  +---------+---------------+---------+-----------+----------+--------------+ RIGHT    CompressibilityPhasicitySpontaneityPropertiesThrombus Aging +---------+---------------+---------+-----------+----------+--------------+ CFV      Full           Yes      Yes                                 +---------+---------------+---------+-----------+----------+--------------+  SFJ      Full                                                        +---------+---------------+---------+-----------+----------+--------------+ FV Prox  Full           Yes      Yes                                 +---------+---------------+---------+-----------+----------+--------------+ FV Mid   Full           Yes      Yes                                 +---------+---------------+---------+-----------+----------+--------------+ FV DistalFull           Yes      Yes                                 +---------+---------------+---------+-----------+----------+--------------+ PFV      Full                                                        +---------+---------------+---------+-----------+----------+--------------+ POP      Full            Yes      Yes                                 +---------+---------------+---------+-----------+----------+--------------+ PTV      Full                                                        +---------+---------------+---------+-----------+----------+--------------+ PERO     Full                                                        +---------+---------------+---------+-----------+----------+--------------+   +---------+---------------+---------+-----------+----------+--------------+ LEFT     CompressibilityPhasicitySpontaneityPropertiesThrombus Aging +---------+---------------+---------+-----------+----------+--------------+ CFV      Full           Yes      Yes                                 +---------+---------------+---------+-----------+----------+--------------+ SFJ      Full                                                        +---------+---------------+---------+-----------+----------+--------------+  FV Prox  Full           Yes      Yes                                 +---------+---------------+---------+-----------+----------+--------------+ FV Mid   Full           Yes      Yes                                 +---------+---------------+---------+-----------+----------+--------------+ FV DistalFull           Yes      Yes                                 +---------+---------------+---------+-----------+----------+--------------+ PFV      Full                                                        +---------+---------------+---------+-----------+----------+--------------+ POP      Full           Yes      Yes                                 +---------+---------------+---------+-----------+----------+--------------+ PTV      Full                                                        +---------+---------------+---------+-----------+----------+--------------+ PERO     Full                                                         +---------+---------------+---------+-----------+----------+--------------+     Summary: BILATERAL: - No evidence of deep vein thrombosis seen in the lower extremities, bilaterally. -No evidence of popliteal cyst, bilaterally.   *See table(s) above for measurements and observations. Electronically signed by Coral Else MD on 04/25/2023 at 8:33:51 PM.    Final    ECHOCARDIOGRAM COMPLETE Result Date: 04/25/2023    ECHOCARDIOGRAM REPORT   Patient Name:   Victor Branch Date of Exam: 04/25/2023 Medical Rec #:  [id number]     Height:       72.0 in Accession #:    [id number]    Weight:       162.2 lb Date of Birth:  [birth year]     BSA:          1.949 m Patient Age:    52 years      BP:           134/76 mmHg Patient Gender: M             HR:           91 bpm. Exam Location:  Inpatient Procedure: 2D Echo, Cardiac Doppler,  Color Doppler and Strain Analysis Indications:    Pulmonary Embolism  History:        Patient has no prior history of Echocardiogram examinations.                 Pulmonary Embolism.  Sonographer:    Webb Laws Referring Phys: [id number] SAMTANI IMPRESSIONS  1. Left ventricular ejection fraction, by estimation, is 55 to 60%. The left ventricle has normal function. The left ventricle has no regional wall motion abnormalities. Left ventricular diastolic parameters were normal.  2. Right ventricular systolic function is normal. The right ventricular size is normal. Tricuspid regurgitation signal is inadequate for assessing PA pressure.  3. A small pericardial effusion is present.  4. The mitral valve is normal in structure. No evidence of mitral valve regurgitation.  5. The aortic valve was not well visualized. Aortic valve regurgitation is not visualized. No aortic stenosis is present.  6. The inferior vena cava is dilated in size with >50% respiratory variability, suggesting right atrial pressure of 8 mmHg. FINDINGS  Left Ventricle: Left ventricular ejection fraction, by estimation, is  55 to 60%. The left ventricle has normal function. The left ventricle has no regional wall motion abnormalities. The left ventricular internal cavity size was normal in size. There is  no left ventricular hypertrophy. Left ventricular diastolic parameters were normal. Right Ventricle: The right ventricular size is normal. No increase in right ventricular wall thickness. Right ventricular systolic function is normal. Tricuspid regurgitation signal is inadequate for assessing PA pressure. Left Atrium: Left atrial size was normal in size. Right Atrium: Right atrial size was normal in size. Pericardium: A small pericardial effusion is present. Mitral Valve: The mitral valve is normal in structure. No evidence of mitral valve regurgitation. Tricuspid Valve: The tricuspid valve is normal in structure. Tricuspid valve regurgitation is trivial. Aortic Valve: The aortic valve was not well visualized. Aortic valve regurgitation is not visualized. No aortic stenosis is present. Pulmonic Valve: The pulmonic valve was not well visualized. Pulmonic valve regurgitation is not visualized. Aorta: The aortic root and ascending aorta are structurally normal, with no evidence of dilitation. Venous: The inferior vena cava is dilated in size with greater than 50% respiratory variability, suggesting right atrial pressure of 8 mmHg. IAS/Shunts: The interatrial septum was not well visualized.  LEFT VENTRICLE PLAX 2D LVIDd:         3.90 cm     Diastology LVIDs:         2.90 cm     LV e' medial:    7.77 cm/s LV PW:         1.00 cm     LV E/e' medial:  6.9 LV IVS:        1.10 cm     LV e' lateral:   9.48 cm/s LVOT diam:     2.20 cm     LV E/e' lateral: 5.7 LV SV:         58 LV SV Index:   30 LVOT Area:     3.80 cm  LV Volumes (MOD) LV vol d, MOD A2C: 54.3 ml LV vol d, MOD A4C: 53.2 ml LV vol s, MOD A2C: 23.6 ml LV vol s, MOD A4C: 20.7 ml LV SV MOD A2C:     30.7 ml LV SV MOD A4C:     53.2 ml LV SV MOD BP:      31.7 ml RIGHT VENTRICLE  IVC RV Basal diam:  2.90 cm     IVC diam: 2.20 cm RV S prime:     14.10 cm/s TAPSE (M-mode): 2.2 cm LEFT ATRIUM           Index        RIGHT ATRIUM           Index LA diam:      2.40 cm 1.23 cm/m   RA Area:     10.50 cm LA Vol (A2C): 27.6 ml 14.16 ml/m  RA Volume:   19.30 ml  9.90 ml/m LA Vol (A4C): 13.5 ml 6.93 ml/m  AORTIC VALVE LVOT Vmax:   105.00 cm/s LVOT Vmean:  72.000 cm/s LVOT VTI:    0.152 m  AORTA Ao Root diam: 3.50 cm Ao Asc diam:  3.30 cm MITRAL VALVE MV Area (PHT): 5.66 cm    SHUNTS MV Decel Time: 134 msec    Systemic VTI:  0.15 m MV E velocity: 53.60 cm/s  Systemic Diam: 2.20 cm MV A velocity: 69.40 cm/s MV E/A ratio:  0.77 Epifanio Lesches MD Electronically signed by Epifanio Lesches MD Signature Date/Time: 04/25/2023/11:18:55 AM    Final    CT Angio Chest PE W/Cm &/Or Wo Cm Result Date: 04/23/2023 CLINICAL DATA:  Right subscapular back pain. EXAM: CT ANGIOGRAPHY CHEST WITH CONTRAST TECHNIQUE: Multidetector CT imaging of the chest was performed using the standard protocol during bolus administration of intravenous contrast. Multiplanar CT image reconstructions and MIPs were obtained to evaluate the vascular anatomy. RADIATION DOSE REDUCTION: This exam was performed according to the departmental dose-optimization program which includes automated exposure control, adjustment of the mA and/or kV according to patient size and/or use of iterative reconstruction technique. CONTRAST:  75mL OMNIPAQUE IOHEXOL 350 MG/ML SOLN COMPARISON:  July 22, 2019 FINDINGS: Cardiovascular: The thoracic aorta is normal in appearance. Satisfactory opacification of the pulmonary arteries to the segmental level. Moderate to marked severity areas of intraluminal low attenuation are seen involving multiple lower lobe branches of the right pulmonary artery. Mild involvement of lower lobe branches of the left pulmonary artery is also seen. Normal heart size without evidence of right heart strain (RV/LV ratio of  0.84). No pericardial effusion. Mediastinum/Nodes: No enlarged mediastinal, hilar, or axillary lymph nodes. Thyroid gland, trachea, and esophagus demonstrate no significant findings. Lungs/Pleura: Mild scarring and/or atelectasis is seen within the right apex. Moderate severity areas of scarring, atelectasis and/or infiltrate are seen within the bilateral lung bases. There is a very small right pleural effusion. No pneumothorax is identified. Upper Abdomen: No acute abnormality. Musculoskeletal: No chest wall abnormality. No acute or significant osseous findings. Review of the MIP images confirms the above findings. IMPRESSION: 1. Moderate to marked severity bilateral lower lobe pulmonary emboli, right greater than left. 2. Moderate severity bibasilar scarring, atelectasis and/or infiltrate. 3. Very small right pleural effusion. Electronically Signed   By: Aram Candela M.D.   On: 04/23/2023 23:10    Microbiology: Results for orders placed or performed during the hospital encounter of 04/23/23  SARS Coronavirus 2 by RT PCR (hospital order, performed in [facility] hospital lab) *cepheid single result test* Anterior Nasal Swab     Status: None   Collection Time: 04/24/23 10:18 PM   Specimen: Anterior Nasal Swab  Result Value Ref Range Status   SARS Coronavirus 2 by RT PCR NEGATIVE NEGATIVE Final    Comment: Performed at [facility], [address] [place], [place] [address]  Respiratory (~20 pathogens) panel by PCR  Status: Abnormal   Collection Time: 04/24/23 10:18 PM   Specimen: Nasopharyngeal Swab; Respiratory  Result Value Ref Range Status   Adenovirus NOT DETECTED NOT DETECTED Final   Coronavirus 229E NOT DETECTED NOT DETECTED Final    Comment: (NOTE) The Coronavirus on the Respiratory Panel, DOES NOT test for the novel  Coronavirus (2019 nCoV)    Coronavirus HKU1 NOT DETECTED NOT DETECTED Final   Coronavirus NL63 NOT DETECTED NOT DETECTED Final   Coronavirus OC43 NOT  DETECTED NOT DETECTED Final   Metapneumovirus NOT DETECTED NOT DETECTED Final   Rhinovirus / Enterovirus NOT DETECTED NOT DETECTED Final   Influenza A NOT DETECTED NOT DETECTED Final   Influenza B NOT DETECTED NOT DETECTED Final   Parainfluenza Virus 1 NOT DETECTED NOT DETECTED Final   Parainfluenza Virus 2 NOT DETECTED NOT DETECTED Final   Parainfluenza Virus 3 NOT DETECTED NOT DETECTED Final   Parainfluenza Virus 4 NOT DETECTED NOT DETECTED Final   Respiratory Syncytial Virus DETECTED (A) NOT DETECTED Final   Bordetella pertussis NOT DETECTED NOT DETECTED Final   Bordetella Parapertussis NOT DETECTED NOT DETECTED Final   Chlamydophila pneumoniae NOT DETECTED NOT DETECTED Final   Mycoplasma pneumoniae NOT DETECTED NOT DETECTED Final    Comment: Performed at [facility], [address], [place], [place] [address]    Labs: CBC: Recent Labs  Lab 04/23/23 2043 04/25/23 0459 04/26/23 0355  WBC 9.8 8.1 7.8  HGB 10.9* 11.8* 12.2*  HCT 33.7* 36.5* 37.5*  MCV 84.9 84.3 84.7  PLT 280 282 289   Basic Metabolic Panel: Recent Labs  Lab 04/23/23 2043 04/25/23 0459 04/26/23 0355  NA 132* 136 132*  K 3.6 4.1 4.0  CL 100 106 104  CO2 24 22 21*  GLUCOSE 109* 92 103*  BUN 15 9 10   CREATININE 1.07 1.01 1.00  CALCIUM 8.5* 8.7* 8.5*   Liver Function Tests: Recent Labs  Lab 04/25/23 0459  AST 12*  ALT 17  ALKPHOS 85  BILITOT 1.0  PROT 5.8*  ALBUMIN 2.6*   CBG: No results for input(s): "GLUCAP" in the last 168 hours.  Discharge time spent: greater than 30 minutes.  Signed: Thad Ranger, MD [facility] 04/26/2023

## 2023-04-26 NOTE — Progress Notes (Signed)
DISCHARGE NOTE HOME Victor Branch to be discharged Home per MD order. Discussed prescriptions and follow up appointments with the patient. Prescriptions given to patient; medication list explained in detail. Patient verbalized understanding.  Skin clean, dry and intact without evidence of skin break down, no evidence of skin tears noted. IV catheter discontinued intact. Site without signs and symptoms of complications. Dressing and pressure applied. Pt denies pain at the site currently. No complaints noted.  Patient free of lines, drains, and wounds.   An After Visit Summary (AVS) was printed and given to the patient reviewed and all questions answered. Patient escorted via wheelchair, and discharged home via private auto.  Velia Meyer, RN
